# Patient Record
Sex: Male | Born: 1950 | Race: Black or African American | Hispanic: No | Marital: Married | Smoking: Current every day smoker
Health system: Southern US, Community
[De-identification: ages and names within clinical notes are randomized; demographics above are authoritative.]

## PROBLEM LIST (undated history)

## (undated) DIAGNOSIS — R7303 Prediabetes: Secondary | ICD-10-CM

## (undated) DIAGNOSIS — F1410 Cocaine abuse, uncomplicated: Secondary | ICD-10-CM

## (undated) DIAGNOSIS — R569 Unspecified convulsions: Secondary | ICD-10-CM

## (undated) DIAGNOSIS — I10 Essential (primary) hypertension: Secondary | ICD-10-CM

## (undated) DIAGNOSIS — B1920 Unspecified viral hepatitis C without hepatic coma: Secondary | ICD-10-CM

## (undated) DIAGNOSIS — Z720 Tobacco use: Secondary | ICD-10-CM

## (undated) DIAGNOSIS — I6381 Other cerebral infarction due to occlusion or stenosis of small artery: Secondary | ICD-10-CM

## (undated) HISTORY — PX: BACK SURGERY: SHX140

---

## 1999-04-06 ENCOUNTER — Encounter: Payer: Self-pay | Admitting: *Deleted

## 1999-04-06 ENCOUNTER — Emergency Department (HOSPITAL_COMMUNITY): Admission: EM | Admit: 1999-04-06 | Discharge: 1999-04-06 | Payer: Self-pay | Admitting: *Deleted

## 2006-01-10 ENCOUNTER — Emergency Department (HOSPITAL_COMMUNITY): Admission: EM | Admit: 2006-01-10 | Discharge: 2006-01-10 | Payer: Self-pay | Admitting: Emergency Medicine

## 2006-01-25 ENCOUNTER — Emergency Department (HOSPITAL_COMMUNITY): Admission: EM | Admit: 2006-01-25 | Discharge: 2006-01-25 | Payer: Self-pay | Admitting: Emergency Medicine

## 2006-07-24 ENCOUNTER — Emergency Department (HOSPITAL_COMMUNITY): Admission: EM | Admit: 2006-07-24 | Discharge: 2006-07-24 | Payer: Self-pay | Admitting: Emergency Medicine

## 2006-11-03 ENCOUNTER — Ambulatory Visit: Admission: RE | Admit: 2006-11-03 | Discharge: 2006-11-03 | Payer: Self-pay | Admitting: Orthopedic Surgery

## 2006-12-08 ENCOUNTER — Ambulatory Visit (HOSPITAL_COMMUNITY): Admission: RE | Admit: 2006-12-08 | Discharge: 2006-12-08 | Payer: Self-pay | Admitting: Orthopedic Surgery

## 2007-08-26 ENCOUNTER — Emergency Department (HOSPITAL_COMMUNITY): Admission: EM | Admit: 2007-08-26 | Discharge: 2007-08-26 | Payer: Self-pay | Admitting: Emergency Medicine

## 2007-09-01 ENCOUNTER — Emergency Department (HOSPITAL_COMMUNITY): Admission: EM | Admit: 2007-09-01 | Discharge: 2007-09-01 | Payer: Self-pay | Admitting: Emergency Medicine

## 2007-11-21 ENCOUNTER — Inpatient Hospital Stay (HOSPITAL_COMMUNITY): Admission: RE | Admit: 2007-11-21 | Discharge: 2007-11-25 | Payer: Self-pay | Admitting: Orthopedic Surgery

## 2007-11-28 ENCOUNTER — Emergency Department (HOSPITAL_COMMUNITY): Admission: EM | Admit: 2007-11-28 | Discharge: 2007-11-28 | Payer: Self-pay | Admitting: Emergency Medicine

## 2008-02-16 ENCOUNTER — Encounter: Admission: RE | Admit: 2008-02-16 | Discharge: 2008-05-16 | Payer: Self-pay | Admitting: Orthopedic Surgery

## 2009-05-18 ENCOUNTER — Emergency Department (HOSPITAL_COMMUNITY): Admission: EM | Admit: 2009-05-18 | Discharge: 2009-05-18 | Payer: Self-pay | Admitting: Emergency Medicine

## 2009-08-13 IMAGING — CR DG KNEE 1-2V PORT*L*
2 series · 2 of 2 positions shown · non-contrast
Comparison: None.

CLINICAL DATA: Osteoarthritis.  Status post knee replacement.

PORTABLE LEFT KNEE - 1-2 VIEW

[view not recorded (1 of 2)]
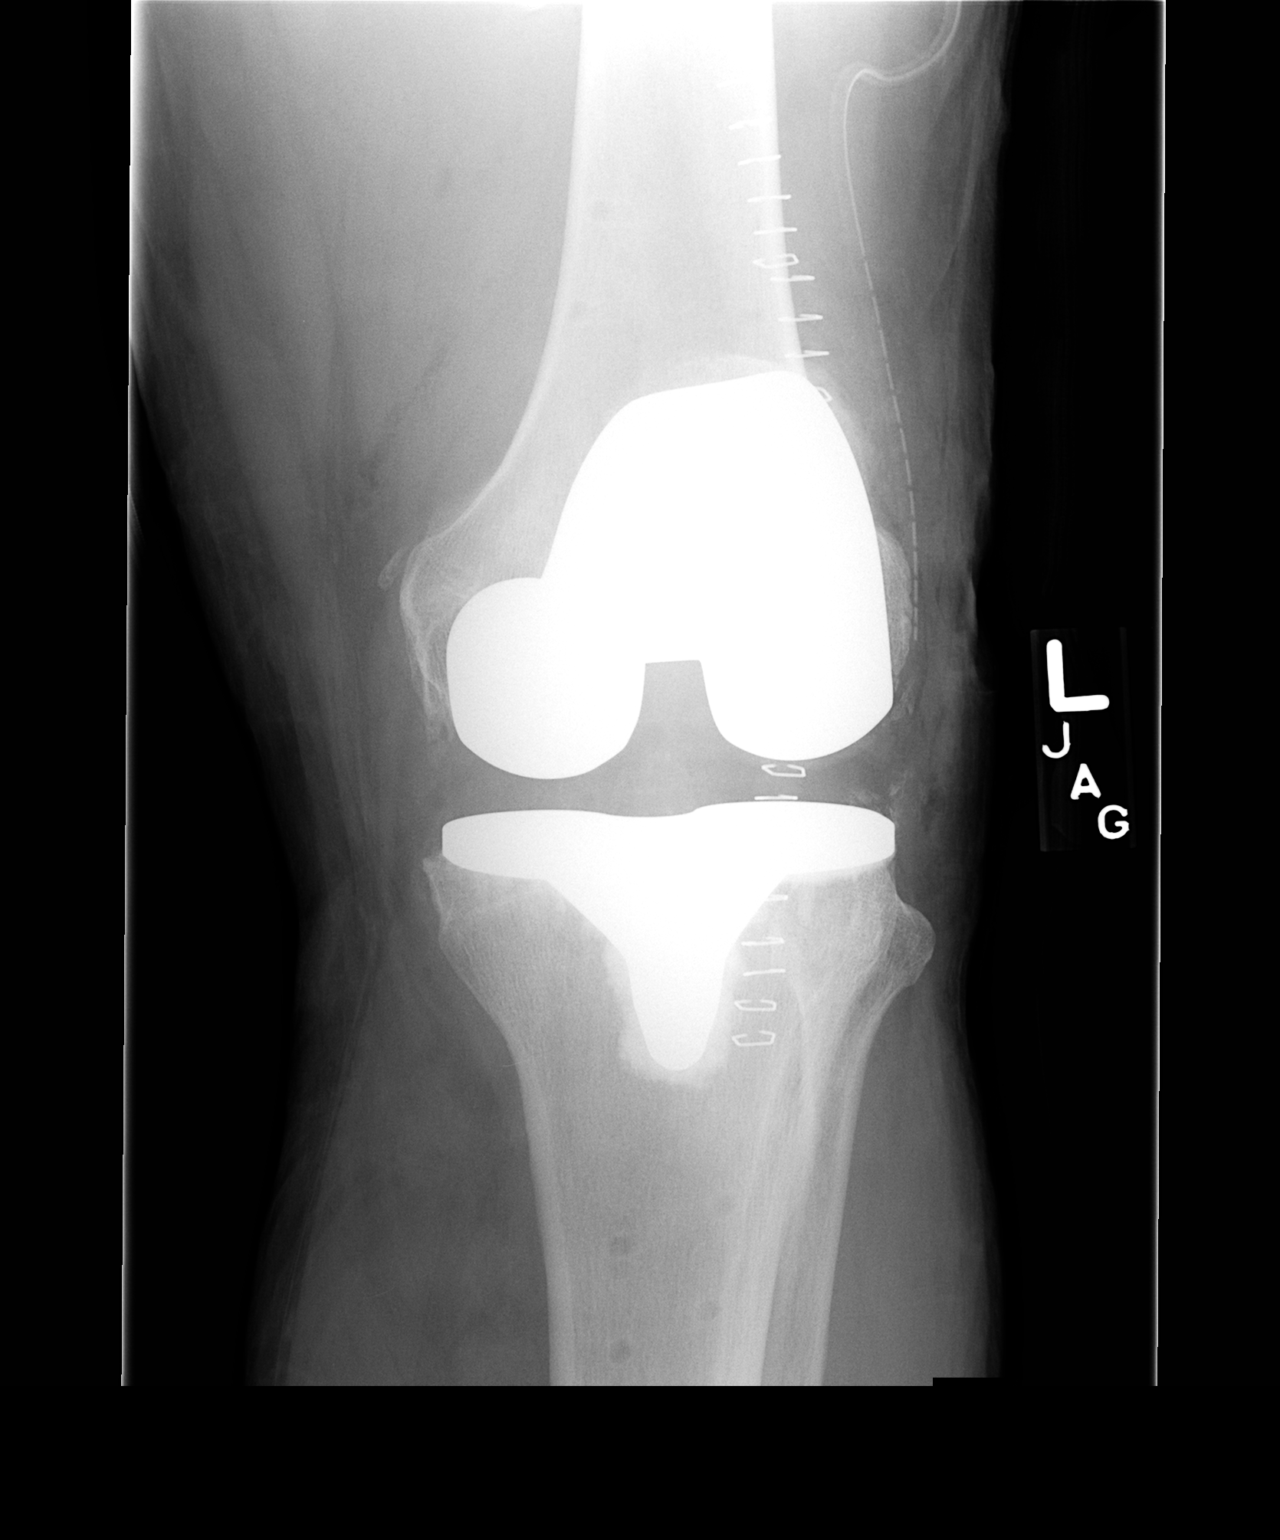

[view not recorded (2 of 2)]
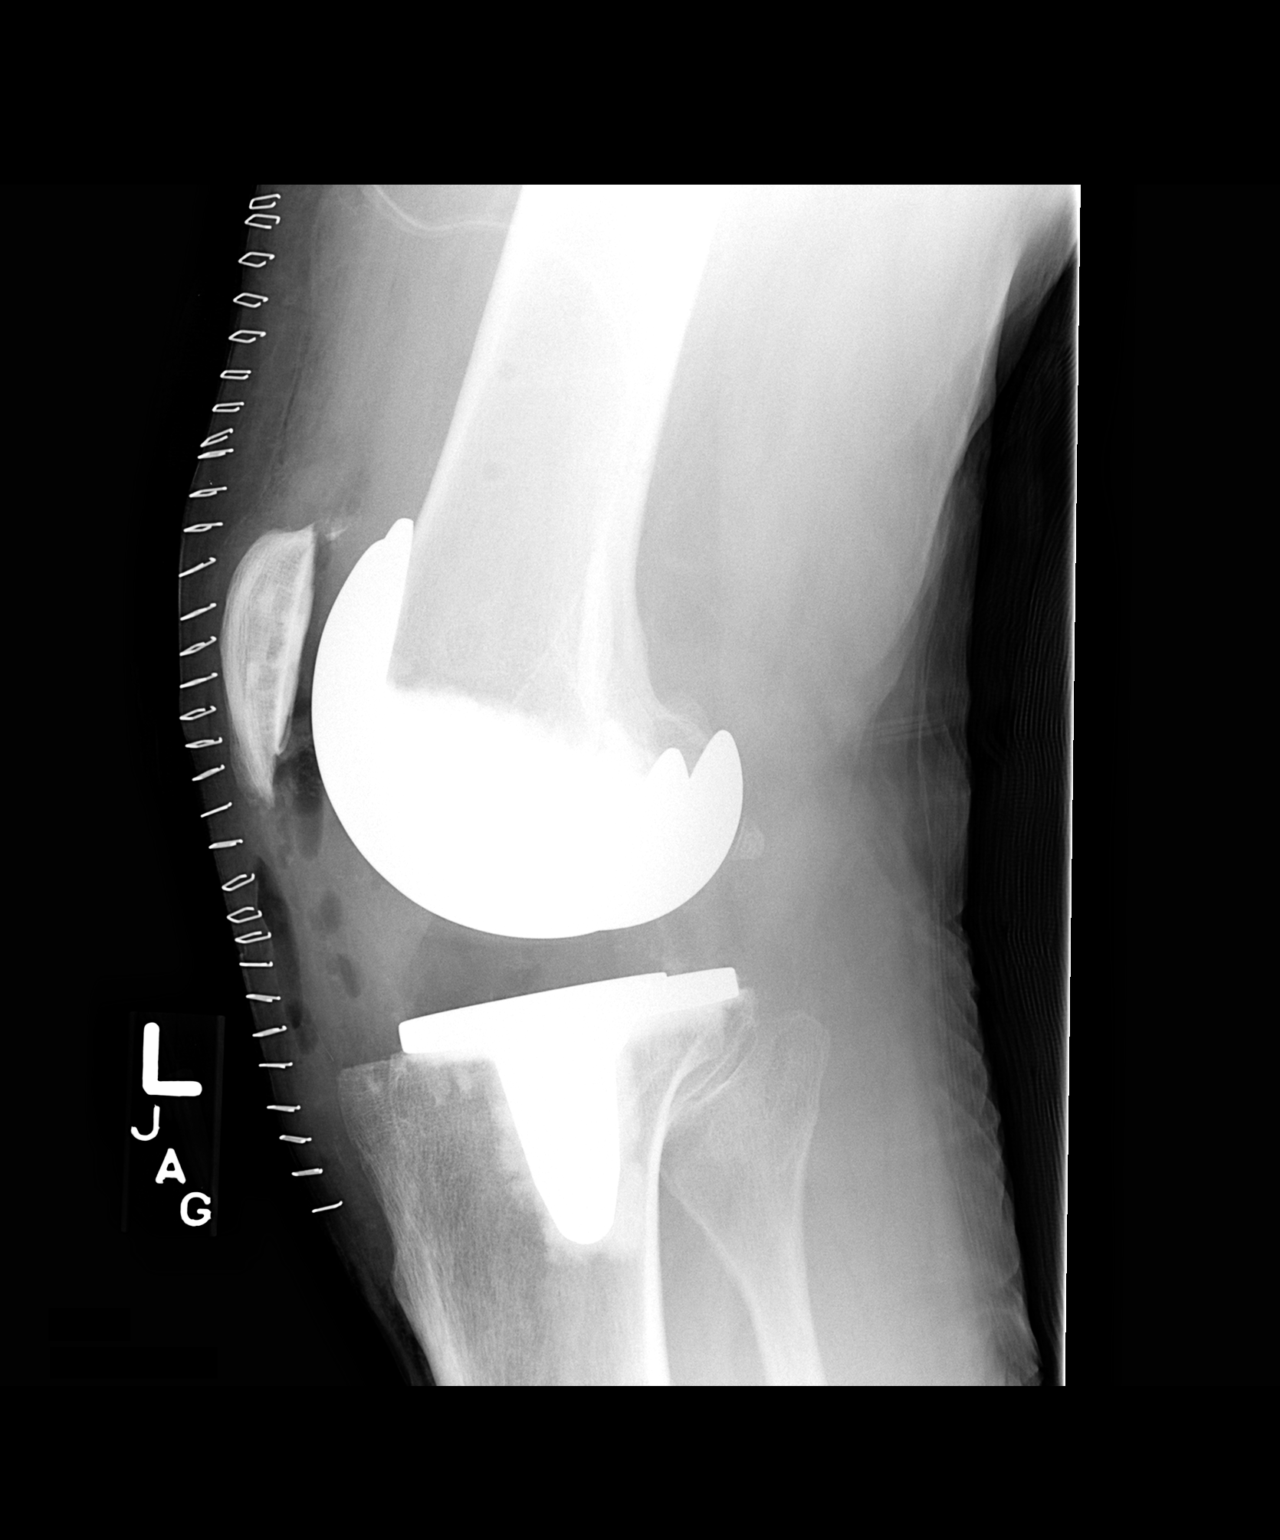

[2 of 2 positions shown; findings below may reference images not displayed]

FINDINGS: Status post total left knee replacement appearing in
satisfactory position.  Surgical drain is in place.
IMPRESSION: Left total knee replacement appears in satisfactory position.

## 2010-04-07 LAB — BASIC METABOLIC PANEL
BUN: 10 mg/dL (ref 6–23)
CO2: 26 mEq/L (ref 19–32)
Chloride: 106 mEq/L (ref 96–112)
GFR calc Af Amer: >60 mL/min (ref >60)
Glucose, Bld: 119 mg/dL — ABNORMAL HIGH (ref 70–99)

## 2010-04-07 LAB — CBC
MCHC: 33.4 g/dL (ref 30.0–36.0)
RDW: 15.5 % (ref 11.5–15.5)

## 2010-04-07 LAB — POCT CARDIAC MARKERS: Troponin i, poc: <0.05 ng/mL (ref 0.00–0.09)

## 2010-04-07 LAB — DIFFERENTIAL
Basophils Absolute: 0 10*3/uL (ref 0.0–0.1)
Eosinophils Relative: 5 % (ref 0–5)
Lymphocytes Relative: 48 % — ABNORMAL HIGH (ref 12–46)
Lymphs Abs: 3.1 10*3/uL (ref 0.7–4.0)
Monocytes Absolute: 0.5 10*3/uL (ref 0.1–1.0)
Neutro Abs: 2.6 10*3/uL (ref 1.7–7.7)

## 2010-05-13 ENCOUNTER — Emergency Department (HOSPITAL_COMMUNITY)
Admission: EM | Admit: 2010-05-13 | Discharge: 2010-05-13 | Disposition: A | Discharge status: Home / Self Care | Payer: Non-veteran care | Attending: Emergency Medicine | Admitting: Emergency Medicine

## 2010-05-13 DIAGNOSIS — K089 Disorder of teeth and supporting structures, unspecified: Secondary | ICD-10-CM | POA: Insufficient documentation

## 2010-05-13 DIAGNOSIS — I2510 Atherosclerotic heart disease of native coronary artery without angina pectoris: Secondary | ICD-10-CM | POA: Insufficient documentation

## 2010-05-13 DIAGNOSIS — Z8673 Personal history of transient ischemic attack (TIA), and cerebral infarction without residual deficits: Secondary | ICD-10-CM | POA: Insufficient documentation

## 2010-05-13 DIAGNOSIS — M25569 Pain in unspecified knee: Secondary | ICD-10-CM | POA: Insufficient documentation

## 2010-05-13 DIAGNOSIS — M549 Dorsalgia, unspecified: Secondary | ICD-10-CM | POA: Insufficient documentation

## 2010-05-13 DIAGNOSIS — I10 Essential (primary) hypertension: Secondary | ICD-10-CM | POA: Insufficient documentation

## 2010-05-13 DIAGNOSIS — G40909 Epilepsy, unspecified, not intractable, without status epilepticus: Secondary | ICD-10-CM | POA: Insufficient documentation

## 2010-05-13 DIAGNOSIS — G8929 Other chronic pain: Secondary | ICD-10-CM | POA: Insufficient documentation

## 2010-06-02 NOTE — Consult Note (Signed)
[name], Thornton NO.:  [id number]   MEDICAL RECORD NO.:  [id number]          PATIENT TYPE:  INP   LOCATION:  5036                         FACILITY:  [facility]   PHYSICIAN:  Jamison Neighbor, M.D.  DATE OF BIRTH:  07/05/1950   DATE OF CONSULTATION:  11/21/2007  DATE OF DISCHARGE:                                 CONSULTATION   CONSULTING PHYSICIAN:  Burnard Bunting, M.D.   REASON FOR CONSULTATION:  Inability to place Foley catheter.   HISTORY:  This 60 year old male has left-knee osteoarthritis and is  scheduled to undergo left-knee arthroplasty.  The physician assistant  was unable to insert a Foley catheter, then he asked the catheter be  inserted following end of the procedure.   The patient is found in the recovery room.  He is somewhat obtunded from  his surgery, but can give at least a small amount of history.  There is  a history of some prostate problems but is unclear to me whether he has  ever had surgery.  According to the PA, he tried to pass a Coude  catheter, was unable to so, just that there may be at least some degree  of bladder neck obstruction or stricture.   PAST MEDICAL HISTORY:  1. Esophageal reflux.  2. Chronic anxiety.  3. Arthritis.  4. Bronchitis.  5. Depression.  6. Hepatitis C.  7. Hypertension.   FAMILY HISTORY:  He has a family history of diabetes and hypertension.   SOCIAL HISTORY:  Pertinent for a past history of cocaine use.  He does  smoke cigarettes and drinks wine.   The patient has had right knee arthroplasty.  He has also had back  surgery.   MEDICATIONS:  1. Aspirin.  2. Atenolol.  3. Vitamin D3.  4. Fish oil.  5. Hydrochlorothiazide.  6. Hydrocodone.  7. Ibuprofen.  8. Ketoconazole.  9. Lisinopril.  10.Loratadine.  11.Mirtazapine.  12.Nitroglycerin.  13.Quetiapine.  14.Ranitidine.  15.Simvastatin.  16.Albuterol.  17.Terazosin.  18.Acyclovir.   I did not do a rectal examination.  The patient is in a  knee immobilizer  and could not be placed in position for appropriate rectal examination.   I placed a 16-French Foley catheter without difficulty.  Clear urine was  obtained.  The patient did seem to have a slightly tight bladder neck  and this may require evaluation in the future.   Leave the Foley catheter until the patient is ambulatory.  Let me know  if there is any further that I can do to help during this hospital stay.   I appreciate the opportunity to share in his care.      Jamison Neighbor, M.D.  Electronically Signed     RJE/MEDQ  D:  11/21/2007  T:  11/22/2007  Job:  [id number]

## 2010-06-02 NOTE — Op Note (Signed)
NAMESTEVENSON, WINDMILLER NO.:  [id number]   MEDICAL RECORD NO.:  [id number]          PATIENT TYPE:  INP   LOCATION:  5036                         FACILITY:  [facility]   PHYSICIAN:  Burnard Bunting, M.D.    DATE OF BIRTH:  1950/06/13   DATE OF PROCEDURE:  11/21/2007  DATE OF DISCHARGE:                               OPERATIVE REPORT   PREOPERATIVE DIAGNOSIS:  Left knee arthritis.   POSTOPERATIVE DIAGNOSIS:  Left knee arthritis.   PROCEDURE:  Left total knee replacement.   SURGEON:  Burnard Bunting, M.D.   ASSISTANT:  Wende Neighbors, P.A.   ANESTHESIA:  General endotracheal.   ESTIMATED BLOOD LOSS:  150.   DRAINS:  Hemovac x1.   INDICATIONS:  Matthew Thornton is a 60 year old patient with end-stage  left knee arthritis who presents now for operative management of his  knee pain after explanation of risks and benefits.   COMPONENTS:  DePuy rotating platform size 6 femur, 5 tibia, 12.5 spacer,  and 41 patella.   TOURNIQUET TIME:  1 hour 53 minutes at 300 mmHg.   PROCEDURE IN DETAIL:  The patient was brought to the operating room  where general endotracheal anesthesia was induced.  Preoperative  antibiotics administered.  Time-out was called.  Left leg was pre-  scrubbed with chlorhexidine alcohol and Betadine, which allowed to air  dry and then prepped DuraPrep solution and draped in a sterile manner  using an Ioban over the entire operative field.  The leg was elevated  and exsanguinated with an Esmarch, wrapped and tourniquet was inflated.  Anterior approach to the knee was utilized.  The skin and subcutaneous  tissue were sharply divided.  Median parapatellar arthrotomy was made.  Precise location of the arthrotomy was marked with #1 Vicryl suture.  Patella was everted.  Osteophytes were removed.  Lateral patellofemoral  ligament was released.  Fat pad was partially excised.  Synovectomy was  performed because of inflamed tissue surrounding the capsule.  The  ACL  and PCL were released with 2 separate stab incisions, proximal distal  medial bicortical pins were placed in the femur and proximal medial pins  were placed in the anteromedial tibia proximally.  The registration  points were obtained including the hips and rotation ankle as well as  medial and lateral malleolus as well as various points around the knee.  Tibial plateau was then cut in perpendicular mechanical axis.  The  tensioner was placed in both flexion and extension, distal femoral cut  was made, checked with spacer block in both flexion and extension.  The  chamfer cuts were then performed.  Box cut was made, tibia was prepared  with keel punch.  Trial components were placed.  The patient had full  extension, excellent flexion, good stability, varus-valgus stress at 0-  39 degrees with a 12.5 spacer in place.  Patella was then cut freehand  and fitted with 41 patellar button with the patellar height going from  28-25 mm.  The knee had excellent patellar tracking with no-thumbs  technique.  Trial components were removed.  Thorough irrigation was  performed.  The component were then cemented into position.  The excess  cement was then removed.  The same stability parameters and range of  motion parameters were maintained.  Tourniquet was released.  Bleeding  points were encountered for electrocautery.  Hemovac drain was placed.  Thorough irrigation was again performed.  Parapatellar arthrotomy was  then closed using #1 Vicryl suture in an interrupted fashion followed by  interrupted inverted 0 Vicryl suture, 2-0 Vicryl sutures, and skin  staples.  Medial stab incisions for the pins were also irrigated and  closed using 3-0 nylon suture.  A solution of Marcaine, morphine, and  clonidine was injected to the knee.  Bulky dressing and knee immobilizer  was placed.  The patient tolerated the procedure well without immediate  complication.  Velna Hatchet Vernon's assistance was required at  all times  during the case for retraction of __________ neurovascular structures  and limb positioning.  Her assistance was medical necessity.      Burnard Bunting, M.D.  Electronically Signed     GSD/MEDQ  D:  11/21/2007  T:  11/22/2007  Job:  [id number]

## 2010-06-02 NOTE — Discharge Summary (Signed)
NAMEAVRIAN, DELFAVERO NO.:  [id number]   MEDICAL RECORD NO.:  [id number]          PATIENT TYPE:  INP   LOCATION:  5036                         FACILITY:  [facility]   PHYSICIAN:  Burnard Bunting, M.D.    DATE OF BIRTH:  [birth year]   DATE OF ADMISSION:  11/21/2007  DATE OF DISCHARGE:  11/25/2007                               DISCHARGE SUMMARY   ADMISSION DIAGNOSES:  1. Left knee arthritis.  2. Gastroesophageal reflux disease.  3. Depression.  4. Hypertension.  5. Benign prostatic hyperplasia.  6. Seizure disorder.  7. Asthma.  8. Chronic bronchitis.  9. History of hepatitis C.  10.Remote history of cocaine abuse.  11.Chronic anxiety.   DISCHARGE DIAGNOSES:  1. Left knee arthritis.  2. Gastroesophageal reflux disease.  3. Depression.  4. Hypertension.  5. Benign prostatic hyperplasia.  6. Seizure disorder.  7. Asthma.  8. Chronic bronchitis.  9. History of hepatitis C.  10.Remote history of cocaine abuse.  11.Chronic anxiety.  12.Posthemorrhagic anemia.   PROCEDURE:  On November 21, 2007, the patient underwent left total knee  arthroplasty performed by Dr. August Saucer assisted by Maud Deed, PA-C under  general anesthesia.   CONSULTATIONS:  Jamison Neighbor, MD of Urology.   BRIEF HISTORY:  Mr. Wynn is a 60 year old male who has failed  conservative treatment for end-stage osteoarthritis of the left knee.  It was felt he would benefit from surgical intervention in the form of a  left total knee arthroplasty and was admitted for this procedure.   BRIEF HOSPITAL COURSE:  Intraoperatively, the patient had difficult  Foley catheterization.  A consult was called from Dr. Logan Bores who came at  the end of the case and placed a catheter into the patient's bladder.  He did advise that the catheter remained in place until the patient was  ambulatory.  He also discussed with the patient the need for evaluation  by urologist on an outpatient basis and the patient  wished to be seen at  the [facility] once he was discharged.  The patient did keep the Foley  catheter until he was ambulatory and it was discontinued and he was able  to void without difficulties.  Postoperatively, neurovascular motor  function of the lower extremities remained intact.  He was started on  physical therapy for ambulation and gait training, range of motion,  stretching and strengthening exercises.  CPM utilized.  The patient was  weightbearing as tolerated.  Dressing changes done daily and wound was  healing well.  Postoperatively, hemoglobin and hematocrit dropped to the  lowest value of 10.8 and 32.7 and the patient did not require blood  transfusion.  The patient was placed on Coumadin for DVT prophylaxis.  Adjustments in Coumadin dose made according to daily protimes.  INR on  admission 1.0.  At discharge 1.9.  Documented in the chart, an erroneous  value of INR greater than 9.8 on November 24, 2007.  The repeat value on  that day was noted to be 1.8.  The patient had elevated blood sugars  during the hospital stay and dietary changes were  made.  One episode of  hyperkalemia at 5.9, which resolved prior to discharge.  Drug screen on  admission was negative for all drugs tested.  Urinalysis on admission  was negative for urinary tract infection.  He was noted on the  urinalysis to have Trichomonas with moderate leukocyte esterase and  bacteria.  Urine culture was negative.  Postoperative x-ray of the left  knee showed stable position and appearance of left total knee  arthroplasty.   PLAN:  The patient was discharged to his home.  Arrangements made for  home health physical therapy.  He will follow up with Dr. August Saucer in the  office, 2 weeks postop.  He is instructed to keep his wound dry and  clean at all times.  His medications were obtained through mailing  service for the [place] and he had these prior to discharge.  Medication  reconciliation form was provided for the  patient with instructions on  resuming home medications as taken prior to admission with the exception  of lidocaine ointment, piroxicam, and atenolol.  He will use Percocet  5/325 one to two every 4-6 hours as needed for pain, Robaxin 500 mg one  every 8 hours as needed for spasm and Coumadin 5 mg daily.  All  questions encouraged and answered.   CONDITION ON DISCHARGE:  Stable.      Wende Neighbors, P.A.      Burnard Bunting, M.D.  Electronically Signed    SMV/MEDQ  D:  02/08/2008  T:  02/09/2008  Job:  [id number]

## 2010-06-03 ENCOUNTER — Emergency Department (HOSPITAL_COMMUNITY)
Admission: EM | Admit: 2010-06-03 | Discharge: 2010-06-03 | Disposition: A | Discharge status: Home / Self Care | Payer: Non-veteran care | Attending: Emergency Medicine | Admitting: Emergency Medicine

## 2010-06-03 DIAGNOSIS — M25569 Pain in unspecified knee: Secondary | ICD-10-CM | POA: Insufficient documentation

## 2010-06-03 DIAGNOSIS — S335XXA Sprain of ligaments of lumbar spine, initial encounter: Secondary | ICD-10-CM | POA: Insufficient documentation

## 2010-06-03 DIAGNOSIS — Y93E9 Activity, other interior property and clothing maintenance: Secondary | ICD-10-CM | POA: Insufficient documentation

## 2010-06-03 DIAGNOSIS — Z8673 Personal history of transient ischemic attack (TIA), and cerebral infarction without residual deficits: Secondary | ICD-10-CM | POA: Insufficient documentation

## 2010-06-03 DIAGNOSIS — M1990 Unspecified osteoarthritis, unspecified site: Secondary | ICD-10-CM | POA: Insufficient documentation

## 2010-06-03 DIAGNOSIS — R209 Unspecified disturbances of skin sensation: Secondary | ICD-10-CM | POA: Insufficient documentation

## 2010-06-03 DIAGNOSIS — Y998 Other external cause status: Secondary | ICD-10-CM | POA: Insufficient documentation

## 2010-06-03 DIAGNOSIS — G40802 Other epilepsy, not intractable, without status epilepticus: Secondary | ICD-10-CM | POA: Insufficient documentation

## 2010-06-03 DIAGNOSIS — I2510 Atherosclerotic heart disease of native coronary artery without angina pectoris: Secondary | ICD-10-CM | POA: Insufficient documentation

## 2010-06-03 DIAGNOSIS — Y92009 Unspecified place in unspecified non-institutional (private) residence as the place of occurrence of the external cause: Secondary | ICD-10-CM | POA: Insufficient documentation

## 2010-06-03 DIAGNOSIS — X503XXA Overexertion from repetitive movements, initial encounter: Secondary | ICD-10-CM | POA: Insufficient documentation

## 2010-06-03 DIAGNOSIS — I10 Essential (primary) hypertension: Secondary | ICD-10-CM | POA: Insufficient documentation

## 2010-10-05 ENCOUNTER — Emergency Department (HOSPITAL_COMMUNITY)
Admission: EM | Admit: 2010-10-05 | Discharge: 2010-10-05 | Disposition: A | Discharge status: Home / Self Care | Payer: Non-veteran care | Attending: Emergency Medicine | Admitting: Emergency Medicine

## 2010-10-05 DIAGNOSIS — R569 Unspecified convulsions: Secondary | ICD-10-CM | POA: Insufficient documentation

## 2010-10-05 DIAGNOSIS — I10 Essential (primary) hypertension: Secondary | ICD-10-CM | POA: Insufficient documentation

## 2010-10-05 DIAGNOSIS — M79609 Pain in unspecified limb: Secondary | ICD-10-CM | POA: Insufficient documentation

## 2010-10-05 DIAGNOSIS — Z8679 Personal history of other diseases of the circulatory system: Secondary | ICD-10-CM | POA: Insufficient documentation

## 2010-10-05 DIAGNOSIS — M5430 Sciatica, unspecified side: Secondary | ICD-10-CM | POA: Insufficient documentation

## 2010-10-05 DIAGNOSIS — I2510 Atherosclerotic heart disease of native coronary artery without angina pectoris: Secondary | ICD-10-CM | POA: Insufficient documentation

## 2010-10-16 LAB — DIFFERENTIAL
Basophils Absolute: 0
Basophils Relative: 1
Eosinophils Relative: 4
Lymphocytes Relative: 36
Neutro Abs: 2.3
Neutrophils Relative %: 44

## 2010-10-16 LAB — GLUCOSE, CAPILLARY: Glucose-Capillary: 155 — ABNORMAL HIGH

## 2010-10-16 LAB — COMPREHENSIVE METABOLIC PANEL
AST: 32
Albumin: 3.7
CO2: 25
Calcium: 9.2
Chloride: 105
GFR calc non Af Amer: 51 — ABNORMAL LOW
Potassium: 3.9
Sodium: 139
Total Bilirubin: 1

## 2010-10-16 LAB — RAPID URINE DRUG SCREEN, HOSP PERFORMED
Barbiturates: NOT DETECTED
Cocaine: POSITIVE — AB

## 2010-10-16 LAB — ETHANOL: Alcohol, Ethyl (B): <5

## 2010-10-16 LAB — URINE CULTURE: Culture: NO GROWTH

## 2010-10-16 LAB — RPR TITER: RPR Titer: 1:2 {titer} — AB

## 2010-10-16 LAB — URINE MICROSCOPIC-ADD ON

## 2010-10-16 LAB — CBC
Hemoglobin: 13.6
Hemoglobin: 14.5
MCHC: 32.4
MCHC: 32.5
MCV: 78.6
Platelets: 144 — ABNORMAL LOW
Platelets: 165
RBC: 5.68
RDW: 15.9 — ABNORMAL HIGH

## 2010-10-16 LAB — URINALYSIS, ROUTINE W REFLEX MICROSCOPIC
Bilirubin Urine: NEGATIVE
Bilirubin Urine: NEGATIVE
Glucose, UA: 250 — AB
Nitrite: NEGATIVE
Protein, ur: NEGATIVE
Specific Gravity, Urine: 1.022
Urobilinogen, UA: 1
pH: 5
pH: 5

## 2010-10-16 LAB — POCT CARDIAC MARKERS
CKMB, poc: 2.6
Myoglobin, poc: 191
Troponin i, poc: <0.05

## 2010-10-16 LAB — POCT I-STAT, CHEM 8
Chloride: 107
HCT: 43
Hemoglobin: 14.6
Potassium: 4.2
Sodium: 139

## 2010-10-16 LAB — RPR: RPR Ser Ql: REACTIVE — AB

## 2010-10-16 LAB — T.PALLIDUM AB, IGG: T pallidum Antibodies (TP-PA): >8 — ABNORMAL HIGH

## 2010-10-16 LAB — APTT: aPTT: 31

## 2010-10-19 LAB — URINALYSIS, ROUTINE W REFLEX MICROSCOPIC
Glucose, UA: NEGATIVE
Ketones, ur: NEGATIVE
Nitrite: NEGATIVE
Protein, ur: NEGATIVE
Urobilinogen, UA: 1

## 2010-10-19 LAB — URINE CULTURE: Culture: NO GROWTH

## 2010-10-19 LAB — CBC
HCT: 48.2
MCHC: 32.3
MCV: 79.2
Platelets: 199
WBC: 5

## 2010-10-19 LAB — COMPREHENSIVE METABOLIC PANEL
BUN: 13
CO2: 26
Calcium: 9.7
Chloride: 106
Creatinine, Ser: 1.14
GFR calc non Af Amer: >60
Total Bilirubin: 1.1

## 2010-10-19 LAB — DIFFERENTIAL
Basophils Absolute: 0
Basophils Relative: 1
Lymphocytes Relative: 44
Neutro Abs: 2
Neutrophils Relative %: 41 — ABNORMAL LOW

## 2010-10-19 LAB — PROTIME-INR
INR: 1
Prothrombin Time: 13.2

## 2010-10-19 LAB — APTT: aPTT: 32

## 2010-10-19 LAB — DRUGS OF ABUSE SCREEN W/O ALC, ROUTINE URINE
Cocaine Metabolites: NEGATIVE
Opiate Screen, Urine: NEGATIVE
Phencyclidine (PCP): NEGATIVE
Propoxyphene: NEGATIVE

## 2010-10-19 LAB — TYPE AND SCREEN

## 2010-10-20 LAB — COMPREHENSIVE METABOLIC PANEL
BUN: 15
CO2: 28
Chloride: 102
Creatinine, Ser: 1.06
GFR calc non Af Amer: >60
Glucose, Bld: 205 — ABNORMAL HIGH
Total Bilirubin: 1

## 2010-10-20 LAB — BASIC METABOLIC PANEL
CO2: 27
CO2: 28
Calcium: 8.2 — ABNORMAL LOW
Chloride: 102
Chloride: 104
GFR calc Af Amer: 40 — ABNORMAL LOW
GFR calc Af Amer: 53 — ABNORMAL LOW
GFR calc Af Amer: >60
GFR calc non Af Amer: 33 — ABNORMAL LOW
Potassium: 4.1
Sodium: 136
Sodium: 136
Sodium: 138

## 2010-10-20 LAB — CBC
HCT: 32.7 — ABNORMAL LOW
HCT: 37.2 — ABNORMAL LOW
Hemoglobin: 10.8 — ABNORMAL LOW
Hemoglobin: 12.1 — ABNORMAL LOW
Hemoglobin: 12.2 — ABNORMAL LOW
Hemoglobin: 13.4
MCHC: 32.5
MCHC: 33
MCHC: 33.5
MCV: 78.2
MCV: 78.3
Platelets: 245
RBC: 4.12 — ABNORMAL LOW
RBC: 4.6
RBC: 5.2
RDW: 15
WBC: 7.6

## 2010-10-20 LAB — URINALYSIS, ROUTINE W REFLEX MICROSCOPIC
Nitrite: NEGATIVE
Specific Gravity, Urine: 1.03
Urobilinogen, UA: 1
pH: 5

## 2010-10-20 LAB — DIFFERENTIAL
Basophils Absolute: 0
Basophils Relative: 0
Lymphocytes Relative: 17
Neutro Abs: 5.3
Neutrophils Relative %: 70

## 2010-10-20 LAB — PROTIME-INR
INR: 1.1
INR: 1.8 — ABNORMAL HIGH
Prothrombin Time: 21.7 — ABNORMAL HIGH

## 2010-10-20 LAB — URINE MICROSCOPIC-ADD ON

## 2010-10-20 LAB — RAPID URINE DRUG SCREEN, HOSP PERFORMED: Benzodiazepines: NOT DETECTED

## 2010-10-27 LAB — BILIRUBIN, DIRECT: Bilirubin, Direct: 0.2

## 2010-10-27 LAB — CBC
HCT: 49.3
Hemoglobin: 15.6
MCV: 78.2
Platelets: 184
RDW: 14.7
WBC: 7.5

## 2010-10-27 LAB — COMPREHENSIVE METABOLIC PANEL
Albumin: 3.7
Alkaline Phosphatase: 83
BUN: 13
Chloride: 105
Creatinine, Ser: 1.26
Glucose, Bld: 113 — ABNORMAL HIGH
Potassium: 4.7
Total Bilirubin: 0.7

## 2010-10-27 LAB — URINALYSIS, ROUTINE W REFLEX MICROSCOPIC
Hgb urine dipstick: NEGATIVE
Ketones, ur: 15 — AB
Nitrite: NEGATIVE
Protein, ur: NEGATIVE
Urobilinogen, UA: 1

## 2010-10-27 LAB — PROTIME-INR
INR: 1
Prothrombin Time: 13.1

## 2010-10-27 LAB — APTT: aPTT: 31

## 2010-10-27 LAB — URINE MICROSCOPIC-ADD ON

## 2010-10-27 LAB — DIFFERENTIAL
Basophils Absolute: 0
Basophils Relative: 1
Eosinophils Absolute: 0.4
Monocytes Relative: 9
Neutro Abs: 3.1
Neutrophils Relative %: 42 — ABNORMAL LOW

## 2010-10-27 LAB — TYPE AND SCREEN
ABO/RH(D): A POS
Antibody Screen: NEGATIVE

## 2010-10-28 LAB — URINALYSIS, ROUTINE W REFLEX MICROSCOPIC
Bilirubin Urine: NEGATIVE
Glucose, UA: NEGATIVE
Hgb urine dipstick: NEGATIVE
Ketones, ur: NEGATIVE
Protein, ur: NEGATIVE
Specific Gravity, Urine: 1.018
Urobilinogen, UA: 0.2
pH: 5

## 2010-10-28 LAB — BASIC METABOLIC PANEL
CO2: 23
Calcium: 9.1
Chloride: 108
Creatinine, Ser: 1
GFR calc Af Amer: >60
Glucose, Bld: 132 — ABNORMAL HIGH
Potassium: 4.8

## 2010-10-28 LAB — CBC
Hemoglobin: 14.8
MCHC: 32.7
MCV: 78.4
RBC: 5.76
RDW: 15.4 — ABNORMAL HIGH

## 2010-10-28 LAB — DIFFERENTIAL
Basophils Absolute: 0
Basophils Relative: 0
Eosinophils Absolute: 0.3
Monocytes Absolute: 0.4
Monocytes Relative: 6
Neutrophils Relative %: 45

## 2010-10-28 LAB — URINE CULTURE: Colony Count: >=100000

## 2010-10-28 LAB — URINE MICROSCOPIC-ADD ON

## 2010-10-28 LAB — TYPE AND SCREEN
ABO/RH(D): A POS
Antibody Screen: NEGATIVE

## 2010-12-30 ENCOUNTER — Emergency Department (HOSPITAL_COMMUNITY)
Admission: EM | Admit: 2010-12-30 | Discharge: 2010-12-30 | Disposition: A | Discharge status: Home / Self Care | Payer: Non-veteran care | Attending: Emergency Medicine | Admitting: Emergency Medicine

## 2010-12-30 DIAGNOSIS — M549 Dorsalgia, unspecified: Secondary | ICD-10-CM | POA: Insufficient documentation

## 2010-12-30 DIAGNOSIS — E119 Type 2 diabetes mellitus without complications: Secondary | ICD-10-CM | POA: Insufficient documentation

## 2010-12-30 DIAGNOSIS — F17200 Nicotine dependence, unspecified, uncomplicated: Secondary | ICD-10-CM | POA: Insufficient documentation

## 2010-12-30 DIAGNOSIS — I10 Essential (primary) hypertension: Secondary | ICD-10-CM | POA: Insufficient documentation

## 2010-12-30 HISTORY — DX: Essential (primary) hypertension: I10

## 2010-12-30 MED ORDER — HYDROCODONE-ACETAMINOPHEN 5-325 MG PO TABS: 1.0000 | ORAL_TABLET | ORAL | Status: AC | PRN

## 2010-12-30 MED ORDER — CYCLOBENZAPRINE HCL 10 MG PO TABS: 10.0000 mg | ORAL_TABLET | Freq: Two times a day (BID) | ORAL | Status: AC | PRN

## 2010-12-30 MED ORDER — HYDROCODONE-ACETAMINOPHEN 5-325 MG PO TABS
1.0000 | ORAL_TABLET | Freq: Once | ORAL | Status: AC
  Administered 2010-12-30: 1 via ORAL
  Filled 2010-12-30: qty 1

## 2010-12-30 NOTE — ED Provider Notes (Signed)
History     CSN: [id number] Arrival date & time: 12/30/2010  7:20 AM   First MD Initiated Contact with Patient 12/30/10 [phone]      Chief Complaint  Patient presents with  . Back Pain    got an MRI yesterday in [place] and began having back pain,  MRI yesterday was of the back.   Last night he rolled over and his back popped.     (Consider location/radiation/quality/duration/timing/severity/associated sxs/prior treatment) Patient is a 60 y.o. male presenting with back pain. The history is provided by the patient.  Back Pain  This is a recurrent problem. The current episode started more than 1 week ago. The problem occurs constantly. Progression since onset: Worse since yesterday after twisting in bed. Associated with: He has a history of previous back surgery with intermittent flare ups. The symptoms are aggravated by bending, twisting and certain positions. Pertinent negatives include no fever, no abdominal pain, no bowel incontinence, no bladder incontinence and no paresthesias.    Past Medical History  Diagnosis Date  . Hypertension   . Diabetes mellitus   . Cardiomyopathy     Past Surgical History  Procedure Date  . Back surgery     History reviewed. No pertinent family history.  History  Substance Use Topics  . Smoking status: Current Everyday Smoker    Types: Cigarettes  . Smokeless tobacco: Never Used  . Alcohol Use: Yes      Review of Systems  Constitutional: Negative for fever and chills.  HENT: Negative.   Respiratory: Negative.   Cardiovascular: Negative.   Gastrointestinal: Negative.  Negative for abdominal pain and bowel incontinence.  Genitourinary: Negative for bladder incontinence.  Musculoskeletal: Positive for back pain.       See HPI  Skin: Negative.   Neurological: Negative.  Negative for paresthesias.    Allergies  Review of patient's allergies indicates no known allergies.  Home Medications  No current outpatient prescriptions on  file.  BP 162/100  Pulse 70  Temp(Src) 98.1 F (36.7 C) (Oral)  Resp 18  Ht 6\' 3"  (1.905 m)  Wt 225 lb (102.059 kg)  BMI 28.12 kg/m2  SpO2 94%  Physical Exam  Constitutional: He is oriented to person, place, and time. He appears well-developed and well-nourished.  HENT:  Head: Normocephalic.  Neck: Normal range of motion. Neck supple.  Pulmonary/Chest: Effort normal.  Abdominal: Soft. Bowel sounds are normal. He exhibits no mass. There is no tenderness. There is no rebound and no guarding.  Musculoskeletal: Normal range of motion. He exhibits no edema.       Right paralumbar tenderness without swelling, discoloration. No sciatic tenderness on right. Distal pulses 2+.  Neurological: He is alert and oriented to person, place, and time. He has normal reflexes. No cranial nerve deficit or sensory deficit.  Skin: Skin is warm and dry. No rash noted.  Psychiatric: He has a normal mood and affect.    ED Course  Procedures (including critical care time)  Labs Reviewed - No data to display No results found.   No diagnosis found.    MDM          Rodena Medin, PA 12/30/10 [phone]

## 2010-12-30 NOTE — ED Notes (Signed)
Last night rolled over in bed and felt his back pop.  He has a hx of back surgery X2 and pain

## 2010-12-30 NOTE — ED Notes (Signed)
[facility] [place] did not get results his pcp is in [place]. Felt a pop in hhis back last night needs pain meds

## 2010-12-30 NOTE — ED Provider Notes (Signed)
Medical screening examination/treatment/procedure(s) were performed by non-physician practitioner and as supervising physician I was immediately available for consultation/collaboration.  Doug Sou, MD 12/30/10 1309

## 2011-07-04 ENCOUNTER — Emergency Department (HOSPITAL_COMMUNITY): Payer: Non-veteran care

## 2011-07-04 ENCOUNTER — Encounter (HOSPITAL_COMMUNITY): Payer: Self-pay | Admitting: Emergency Medicine

## 2011-07-04 ENCOUNTER — Emergency Department (HOSPITAL_COMMUNITY)
Admission: EM | Admit: 2011-07-04 | Discharge: 2011-07-04 | Disposition: A | Discharge status: Home / Self Care | Payer: Non-veteran care | Attending: Emergency Medicine | Admitting: Emergency Medicine

## 2011-07-04 DIAGNOSIS — F17200 Nicotine dependence, unspecified, uncomplicated: Secondary | ICD-10-CM | POA: Insufficient documentation

## 2011-07-04 DIAGNOSIS — Y92009 Unspecified place in unspecified non-institutional (private) residence as the place of occurrence of the external cause: Secondary | ICD-10-CM | POA: Insufficient documentation

## 2011-07-04 DIAGNOSIS — Y9301 Activity, walking, marching and hiking: Secondary | ICD-10-CM | POA: Insufficient documentation

## 2011-07-04 DIAGNOSIS — IMO0002 Reserved for concepts with insufficient information to code with codable children: Secondary | ICD-10-CM | POA: Insufficient documentation

## 2011-07-04 DIAGNOSIS — W19XXXA Unspecified fall, initial encounter: Secondary | ICD-10-CM

## 2011-07-04 DIAGNOSIS — S46909A Unspecified injury of unspecified muscle, fascia and tendon at shoulder and upper arm level, unspecified arm, initial encounter: Secondary | ICD-10-CM | POA: Insufficient documentation

## 2011-07-04 DIAGNOSIS — I10 Essential (primary) hypertension: Secondary | ICD-10-CM | POA: Insufficient documentation

## 2011-07-04 DIAGNOSIS — S40022A Contusion of left upper arm, initial encounter: Secondary | ICD-10-CM

## 2011-07-04 DIAGNOSIS — Y998 Other external cause status: Secondary | ICD-10-CM | POA: Insufficient documentation

## 2011-07-04 DIAGNOSIS — S4980XA Other specified injuries of shoulder and upper arm, unspecified arm, initial encounter: Secondary | ICD-10-CM | POA: Insufficient documentation

## 2011-07-04 DIAGNOSIS — E119 Type 2 diabetes mellitus without complications: Secondary | ICD-10-CM | POA: Insufficient documentation

## 2011-07-04 DIAGNOSIS — S40029A Contusion of unspecified upper arm, initial encounter: Secondary | ICD-10-CM | POA: Insufficient documentation

## 2011-07-04 DIAGNOSIS — W108XXA Fall (on) (from) other stairs and steps, initial encounter: Secondary | ICD-10-CM | POA: Insufficient documentation

## 2011-07-04 DIAGNOSIS — S4991XA Unspecified injury of right shoulder and upper arm, initial encounter: Secondary | ICD-10-CM

## 2011-07-04 MED ORDER — HYDROCODONE-ACETAMINOPHEN 5-325 MG PO TABS
1.0000 | ORAL_TABLET | Freq: Once | ORAL | Status: AC
  Administered 2011-07-04: 1 via ORAL
  Filled 2011-07-04: qty 1

## 2011-07-04 MED ORDER — HYDROCODONE-ACETAMINOPHEN 5-325 MG PO TABS: 1.0000 | ORAL_TABLET | Freq: Four times a day (QID) | ORAL | Status: AC | PRN

## 2011-07-04 MED ORDER — NAPROXEN 500 MG PO TABS: 500.0000 mg | ORAL_TABLET | Freq: Two times a day (BID) | ORAL | Status: DC

## 2011-07-04 NOTE — Discharge Instructions (Signed)
As we discussed, you can wear the shoulder immobilizer for comfort. He should perform the demonstrated range of motion exercises a few times a day. Apply ice to your injured areas for 15-20 minutes three times a day for the next several days to help reduce swelling and inflammation. Clean your wounds with warm soapy water each day. Apply antibiotic ointment well the wounds are fresh and a clean dressing as needed.    Shoulder Pain The shoulder is a ball and socket joint. The muscles and tendons (rotator cuff) are what keep the shoulder in its joint and stable. This collection of muscles and tendons holds in the head (ball) of the humerus (upper arm bone) in the fossa (cup) of the scapula (shoulder blade). Today no reason was found for your shoulder pain. Often pain in the shoulder may be treated conservatively with temporary immobilization. For example, holding the shoulder in one place using a sling for rest. Physical therapy may be needed if problems continue. HOME CARE INSTRUCTIONS   Apply ice to the sore area for 15 to 20 minutes, 3 to 4 times per day for the first 2 days. Put the ice in a plastic bag. Place a towel between the bag of ice and your skin.   If you have or were given a shoulder sling and straps, do not remove for as long as directed by your caregiver or until you see a caregiver for a follow-up examination. If you need to remove it to shower or bathe, move your arm as little as possible.   Sleep on several pillows at night to lessen swelling and pain.   Only take over-the-counter or prescription medicines for pain, discomfort, or fever as directed by your caregiver.   Keep any follow-up appointments in order to avoid any type of permanent shoulder disability or chronic pain problems.  SEEK MEDICAL CARE IF:   Pain in your shoulder increases or new pain develops in your arm, hand, or fingers.   Your hand or fingers are colder than your other hand.   You do not obtain pain  relief with the medications or your pain becomes worse.  SEEK IMMEDIATE MEDICAL CARE IF:   Your arm, hand, or fingers are numb or tingling.   Your arm, hand, or fingers are swollen, painful, or turn white or blue.   You develop chest pain or shortness of breath.  MAKE SURE YOU:   Understand these instructions.   Will watch your condition.   Will get help right away if you are not doing well or get worse.  Document Released: 10/14/2004 Document Revised: 12/24/2010 Document Reviewed: 12/19/2010 [facility] Patient Information 2012 [place], [place].      Abrasions Abrasions are skin scrapes. Their treatment depends on how large and deep the abrasion is. Abrasions do not extend through all layers of the skin. A cut or lesion through all skin layers is called a laceration. HOME CARE INSTRUCTIONS   If you were given a dressing, change it at least once a day or as instructed by your caregiver. If the bandage sticks, soak it off with a solution of water or hydrogen peroxide.   Twice a day, wash the area with soap and water to remove all the cream/ointment. You may do this in a sink, under a tub faucet, or in a shower. Rinse off the soap and pat dry with a clean towel. Look for signs of infection (see below).   Reapply cream/ointment according to your caregiver's instruction. This will help  prevent infection and keep the bandage from sticking. Telfa or gauze over the wound and under the dressing or wrap will also help keep the bandage from sticking.   If the bandage becomes wet, dirty, or develops a foul smell, change it as soon as possible.   Only take over-the-counter or prescription medicines for pain, discomfort, or fever as directed by your caregiver.  SEEK IMMEDIATE MEDICAL CARE IF:   Increasing pain in the wound.   Signs of infection develop: redness, swelling, surrounding area is tender to touch, or pus coming from the wound.   You have a fever.   Any foul smell coming from the  wound or dressing.  Most skin wounds heal within ten days. Facial wounds heal faster. However, an infection may occur despite proper treatment. You should have the wound checked for signs of infection within 24 to 48 hours or sooner if problems arise. If you were not given a wound-check appointment, look closely at the wound yourself on the second day for early signs of infection listed above. MAKE SURE YOU:   Understand these instructions.   Will watch your condition.   Will get help right away if you are not doing well or get worse.  Document Released: 10/14/2004 Document Revised: 12/24/2010 Document Reviewed: 12/08/2010 [facility] Patient Information 2012 [place], [place].     Contusion A contusion is a deep bruise. Contusions are the result of an injury that caused bleeding under the skin. The contusion may turn blue, purple, or yellow. Minor injuries will give you a painless contusion, but more severe contusions may stay painful and swollen for a few weeks.  CAUSES  A contusion is usually caused by a blow, trauma, or direct force to an area of the body. SYMPTOMS   Swelling and redness of the injured area.   Bruising of the injured area.   Tenderness and soreness of the injured area.   Pain.  DIAGNOSIS  The diagnosis can be made by taking a history and physical exam. An X-ray, CT scan, or MRI may be needed to determine if there were any associated injuries, such as fractures. TREATMENT  Specific treatment will depend on what area of the body was injured. In general, the best treatment for a contusion is resting, icing, elevating, and applying cold compresses to the injured area. Over-the-counter medicines may also be recommended for pain control. Ask your caregiver what the best treatment is for your contusion. HOME CARE INSTRUCTIONS   Put ice on the injured area.   Put ice in a plastic bag.   Place a towel between your skin and the bag.   Leave the ice on for 15 to 20  minutes, 3 to 4 times a day.   Only take over-the-counter or prescription medicines for pain, discomfort, or fever as directed by your caregiver. Your caregiver may recommend avoiding anti-inflammatory medicines (aspirin, ibuprofen, and naproxen) for 48 hours because these medicines may increase bruising.   Rest the injured area.   If possible, elevate the injured area to reduce swelling.  SEEK IMMEDIATE MEDICAL CARE IF:   You have increased bruising or swelling.   You have pain that is getting worse.   Your swelling or pain is not relieved with medicines.  MAKE SURE YOU:   Understand these instructions.   Will watch your condition.   Will get help right away if you are not doing well or get worse.  Document Released: 10/14/2004 Document Revised: 12/24/2010 Document Reviewed: 11/09/2010  Home Safety and Preventing Falls Falls are a leading cause of injury and while they affect all age groups, falls have greater short-term and long-term impact on older age groups. However, falls should not be a part of life or aging. It is possible for individuals and their families to use preventive measures to significantly decrease the likelihood that anyone, especially an older adult, will fall. There are many simple measures which can make your home safer with respect to preventing falls. The following actions can help reduce falls among all members of your family and are especially important as you age, when your balance, lower limb strength, coordination, and eyesight may be declining. The use of preventive measures will help to reduce you and your family's risk of falls and serious medical consequences. OUTDOORS  Repair cracks and edges of walkways and driveways.   Remove high doorway thresholds and trim shrubbery on the main path into your home.   Ensure there is good outside lighting at main entrances and along main walkways.   Clear walkways of tools, rocks, debris, and clutter.     Check that handrails are not broken and are securely fastened. Both sides of steps should have handrails.   In the garage, be attentive to and clean up grease or oil spills on the cement. This can make the surface extremely slippery.   In winter, have leaves, snow, and ice cleared regularly.   Use sand or salt on walkways during winter months.  BATHROOM  Install grab bars by the toilet and in the tub and shower.   Use non-skid mats or decals in the tub or shower.   If unable to easily stand unsupported while showering, place a plastic non slip stool in the shower to sit on when needed.   Install night lights.   Keep floors dry and clean up all water on the floor immediately.   Remove soap buildup in tub or shower on a regular basis.   Secure bath mats with non-slip, double-sided rug tape.   Remove tripping hazards from the floors.  BEDROOMS  Install night lights.   Do not use oversized bedding.   Make sure a bedside light is easy to reach.   Keep a telephone by your bedside.   Make sure that you can get in and out of your bed easily.   Have a firm chair, with side arms, to use for getting dressed.   Remove clutter from around closets.   Store clothing, bed coverings, and other household items where you can reach them comfortably.   Remove tripping hazards from the floor.  LIVING AREAS AND STAIRWAYS  Turn on lights to avoid having to walk through dark areas.   Keep lighting uniform in each room. Place brighter lightbulbs in darker areas, including stairways.   Replace lightbulbs that burn out in stairways immediately.   Arrange furniture to provide for clear pathways.   Keep furniture in the same place.   Eliminate or tape down electrical cables in high traffic areas.   Place handrails on both sides of stairways. Use handrails when going up or down stairs.   Most falls occur on the top or bottom 3 steps.   Fix any loose handrails. Make sure handrails on  both sides of the stairways are as long as the stairs.   Remove all walkway obstacles.   Coil or tape electrical cords off to the side of walking areas and out of the way. If using many extension cords, have  an electrician put in a new wall outlet to reduce or eliminate them.   Make sure spills are cleaned up quickly and allow time for drying before walking on freshly cleaned floors.   Firmly attach carpet with non-skid or two-sided tape.   Keep frequently used items within easy reach.   Remove tripping hazards such as throw rugs and clutter in walkways. Never leave objects on stairs.   Get rid of throw rugs elsewhere if possible.   Eliminate uneven floor surfaces.   Make sure couches and chairs are easy to get into and out of.   Check carpeting to make sure it is firmly attached along stairs.   Make repairs to worn or loose carpet promptly.   Select a carpet pattern that does not visually hide the edge of steps.   Avoid placing throw rugs or scatter rugs at the top or bottom of stairways, or properly secure with carpet tape to prevent slippage.   Have an electrician put in a light switch at the top and bottom of the stairs.   Get light switches that glow.   Avoid the following practices: hurrying, inattention, obscured vision, carrying large loads, and wearing slip-on shoes.   Be aware of all pets.  KITCHEN  Place items that are used frequently, such as dishes and food, within easy reach.   Keep handles on pots and pans toward the center of the stove. Use back burners when possible.   Make sure spills are cleaned up quickly and allow time for drying.   Avoid walking on wet floors.   Avoid hot utensils and knives.   Position shelves so they are not too high or low.   Place commonly used objects within easy reach.   If necessary, use a sturdy step stool with a grab bar when reaching.   Make sure electrical cables are out of the way.   Do not use floor polish or  wax that makes floors slippery.  OTHER HOME FALL PREVENTION STRATEGIES  Wear low heel or rubber sole shoes that are supportive and fit well.   Wear closed toe shoes.   Know and watch for side effects of medications. Have your caregiver or pharmacist look at all your medicines, even over-the-counter medicines. Some medicines can make you sleepy or dizzy.   Exercise regularly. Exercise makes you stronger and improves your balance and coordination.   Limit use of alcohol.   Use eyeglasses if necessary and keep them clean. Have your vision checked every year.   Organize your household in a manner that minimizes the need to walk distances when hurried, or go up and down stairs unnecessarily. For example, have a phone placed on at least each floor of your home. If possible, have a phone beside each sitting or lying area where you spend the most time at home. Keep emergency numbers posted at all phones.   Use non-skid floor wax.   When using a ladder, make sure:   The base is firm.   All ladder feet are on level ground.   The ladder is angled against the wall properly.   When climbing a ladder, face the ladder and hold the ladder rungs firmly.   If reaching, always keep your hips and body weight centered between the rails.   When using a stepladder, make sure it is fully opened and both spreaders are firmly locked.   Do not climb a closed stepladder.   Avoid climbing beyond the second step from the  top of a stepladder and the 4th rung from the top of an extension ladder.   Learn and use mobility aids as needed.   Change positions slowly. Arise slowly from sitting and lying positions. Sit on the edge of your bed before getting to your feet.   If you have a history of falls, ask someone to add color or contrast paint or tape to grab bars and handrails in your home.   If you have a history of falls, ask someone to place contrasting color strips on first and last steps.   Install an  electrical emergency response system if you need one, and know how to use it.   If you have a medical or other condition that causes you to have limited physical strength, it is important that you reach out to family and friends for occasional help.  FOR CHILDREN:  If young children are in the home, use safety gates. At the top of stairs use screw-mounted gates; use pressure-mounted gates for the bottom of the stairs and doorways between rooms.   Young children should be taught to descend stairs on their stomachs, feet first, and later using the handrail.   Keep drawers fully closed to prevent them from being climbed on or pulled out entirely.   Move chairs, cribs, beds and other furniture away from windows.   Consider installing window guards on windows ground floor and up, unless they are emergency fire exits. Make sure they have easy release mechanisms.   Consider installing special locks that only allow the window to be opened to a certain height.   Never rely on window screens to prevent falls.   Never leave babies alone on changing tables, beds or sofas. Use a changing table that has a restraining strap.   When a child can pull to a standing position, the crib mattress should be adjusted to its lowest position. There should be at least 26 inches between the top rails of the crib drop side and the mattress. Toys, bumper pads, and other objects that can be used as steps to climb out should be removed from the crib.   On bunk beds never allow a child under age 78 to sleep on the top bunk. For older children, if the upper bunk is not against a wall, use guard rails on both sides. No matter how old a child is, keep the guard rails in place on the top bunk since children roll during sleep. Do not permit horseplay on bunks.   Grass and soil surfaces beneath backyard playground equipment should be replaced with hardwood chips, shredded wood mulch, sand, pea gravel, rubber, crushed stone, or  another safer material at depths of at least 9 to 12 inches.   When riding bikes or using skates, skateboards, skis, or snowboards, require children to wear helmets. Look for those that have stickers stating that they meet or exceed safety standards.   Vertical posts or pickets in deck, balcony, and stairway railings should be no more than 3 1/2 inches apart if a young baby will have access to the area. The space between horizontal rails or bars, and between the floor and the first horizontal rail or bar, should be no more than 3 1/2 inches.  Document Released: 12/25/2001 Document Revised: 12/24/2010 Document Reviewed: 10/24/2008 [facility] Patient Information 2012 [place], [place]. [employer] Patient Information 2012 [place], [place].

## 2011-07-04 NOTE — Progress Notes (Signed)
Orthopedic Tech Progress Note Patient Details:  Matthew Thornton [birth year] [id number]  Ortho Devices Type of Ortho Device: Sling immobilizer Ortho Device/Splint Interventions: Application   Shawnie Pons 07/04/2011, 9:30 AM

## 2011-07-04 NOTE — ED Provider Notes (Signed)
History     CSN: [id number]  Arrival date & time 07/04/11  3818   First MD Initiated Contact with Patient 07/04/11 [id number]      Chief Complaint  Patient presents with  . Fall    (Consider location/radiation/quality/duration/timing/severity/associated sxs/prior treatment) Patient is a 61 y.o. male presenting with fall. The history is provided by the patient and the spouse.  Fall The accident occurred yesterday. The fall occurred while walking (2 falls- initial occurred when he slipped walking down a hill, second when he tripped walking up steps on porch at home). Distance fallen: from standing. Impact surface: grass initially, then concrete. There was no blood loss. Point of impact: left arm to railing, back. Pain location: left upper arm, right shoulder, lower back. The pain is moderate. He was ambulatory at the scene. There was no entrapment after the fall. There was no drug use involved in the accident. There was no alcohol use involved in the accident.  Denies head injury or LOC. No visual change, CP, SOB, abd pain, n/v, weakness/numbness to extremities. No prior treatment, no medications taken- has run out of naproxen.  Past Medical History  Diagnosis Date  . Hypertension   . Diabetes mellitus   . Cardiomyopathy     Past Surgical History  Procedure Date  . Back surgery     History reviewed. No pertinent family history.  History  Substance Use Topics  . Smoking status: Current Everyday Smoker    Types: Cigarettes  . Smokeless tobacco: Never Used  . Alcohol Use: Yes      Review of Systems 10 systems reviewed and are negative for acute change except as noted in the HPI.  Allergies  Review of patient's allergies indicates no known allergies.  Home Medications   Current Outpatient Rx  Name Route Sig Dispense Refill  . ALBUTEROL SULFATE HFA 108 (90 BASE) MCG/ACT IN AERS Inhalation Inhale 2 puffs into the lungs every 6 (six) hours as needed.      . ATENOLOL 25 MG PO  TABS Oral Take 25 mg by mouth daily.      Marland Kitchen FLUOXETINE HCL 20 MG PO CAPS Oral Take 20 mg by mouth daily.    Marland Kitchen GABAPENTIN 600 MG PO TABS Oral Take 600 mg by mouth 4 (four) times daily.      Marland Kitchen HYDROCHLOROTHIAZIDE 25 MG PO TABS Oral Take 25 mg by mouth daily.      Marland Kitchen LISINOPRIL 40 MG PO TABS Oral Take 40 mg by mouth daily.      Marland Kitchen NAPROXEN 500 MG PO TABS Oral Take 500 mg by mouth 2 (two) times daily with a meal.      . OLANZAPINE 10 MG PO TABS Oral Take 10 mg by mouth at bedtime.    Marland Kitchen RANITIDINE HCL 150 MG PO TABS Oral Take 150 mg by mouth 2 (two) times daily.      Marland Kitchen TERAZOSIN HCL 2 MG PO CAPS Oral Take 2 mg by mouth at bedtime.        BP 126/82  Pulse 67  Temp 98 F (36.7 C) (Oral)  Resp 16  SpO2 95%  Physical Exam  Nursing note reviewed. Constitutional: He is oriented to person, place, and time.       VS reviewed, sig for HTN  Cardiovascular: Normal rate and regular rhythm.        Bilateral radial and DP pulses are 2+   Pulmonary/Chest: Effort normal and breath sounds normal. No respiratory distress. He has  no wheezes. He exhibits no tenderness.  Abdominal: Soft. Bowel sounds are normal. He exhibits no distension. There is no tenderness.  Musculoskeletal:       Left shoulder: Normal.       Right elbow: Normal.      Right shoulder: ROM limited. Strength difficult to assess given resistance to examination, appears to be 3/5 in all fields with only small movements attempted, makes no attempt to move against resistance. Tenderness globally over shoulder. No deformity seen. No pain to palpation over the clavicle. Left forearm with bruising as documented in skin exam, no tenderness to palpation of the entire extremity. Strength preserved to the bicep. Bilateral knees with mild TTP globally with preserved ROM, no deformity, no overlying wounds or skin changes. Strength preserved. No pain to CS, TS. Midline and paravertebral pain to palpation over lower lumbar spine with no stepoff or  deformity. Overlying well-healed surgical scar seen.   Neurological: He is alert and oriented to person, place, and time. Cranial nerve deficit: 3-12 intact.       Antalgic gait without ataxia. Sensation intact to light touch in all extremities distally. Bilateral patellar reflexes 2+.  Skin: Skin is warm and dry.       Ecchymosis over left bicep with abrasion, no active bleeding, minimal surrounding erythema, no wound drainage. Abrasions to bilateral anterior lower legs. Right approx 5mm, minimal surrounding erythema with no bleeding, wound drainage, or induration. Left approx 1cm, no surrounding erythema, no bleeding, wound drainage, induration.     ED Course  Procedures (including critical care time)  Labs Reviewed - No data to display Dg Lumbar Spine Complete  07/04/2011  *RADIOLOGY REPORT*  Clinical Data: Fall.  Back pain.  Prior lumbar surgery.  LUMBAR SPINE - COMPLETE 4+ VIEW  Comparison: None.  Findings: Significant L4-5 disc degeneration, disc space narrowing and osteophyte formation.  Mild L5-S1 disc space narrowing.  Facet joint degenerative changes L4-5 and L5-S1.  Small Schmorl's node deformity L3-4.  Minimal spurring superior aspect T12 without discrete lumbar or lower thoracic spine fracture noted.  Vascular calcifications.  Caliber of the aorta incompletely assessed.  IMPRESSION: No acute fracture noted as discussed above.  Significant degenerative changes.  Calcified aorta and branch vessels.  Caliber of the aorta are not adequately assessed.  Original Report Authenticated By: Fuller Canada, M.D.   Dg Shoulder Right  07/04/2011  *RADIOLOGY REPORT*  Clinical Data: Post fall, now with shoulder pain  RIGHT SHOULDER - 2+ VIEW  Comparison: None.  Findings: No fracture or dislocation.  No significant degenerative change of the glenohumeral joint.  There is mild to moderate degenerative change of the [facility] joint with joint space loss, subchondral sclerosis and inferiorly-directed  osteophytosis. No evidence of calcific tendonitis.  Regional soft tissues are normal. Limited visualization of the adjacent thorax is normal.  IMPRESSION: 1.  No fracture or dislocation. 2.  Mild to moderate degenerative changes of the [facility] joint.  Original Report Authenticated By: Waynard Reeds, M.D.     1. Fall   2. Right shoulder injury   3. Contusion of left arm   4. Abrasion, leg without infection       MDM  Mechanical fall x 2. No head injury or LOC. Injuries described above to bilateral upper extremities, lower back. Imaging of right shoulder, L-spine warranted and show no acute findings.  No back pain red flags ro N/V deficit on exam. Concern for rotator cuff injury vs poor effort on exam - shoulder  immobilizer placed in ED with instructions for ortho f/u if not improving appropriately. Left arm contusion without pain or other PE abnormality to the area- no imaging warranted. Discussed wound care with pt and spouse, return precautions discussed.         Shaaron Adler, [place] 07/04/11 [phone]

## 2011-07-04 NOTE — ED Notes (Addendum)
Patient reports that he lost his balance/tripped twice yesterday and fell.  Patient reports having "bad knees".  Patient fell in grass one time; second time, patient fell on the porch -- patient tried to catch himself both times.  Complaining of pain in right shoulder; denies passing out or hitting head during both falls.

## 2011-07-04 NOTE — ED Notes (Signed)
Abrasions to bilateral shins, cleansed  With NSS and bacitracin applied. Abrasion to his rt. Lower shin has mild redness noted. No warmth or drainage noted.  Lt. Shin abrasions have no redness, drainage or swelling noted.

## 2011-07-06 NOTE — ED Provider Notes (Signed)
Medical screening examination/treatment/procedure(s) were performed by non-physician practitioner and as supervising physician I was immediately available for consultation/collaboration.  Toneka Fullen R. Jode Lippe, MD 07/06/11 1459 

## 2012-10-14 ENCOUNTER — Encounter (HOSPITAL_COMMUNITY): Payer: Self-pay | Admitting: Emergency Medicine

## 2012-10-14 ENCOUNTER — Inpatient Hospital Stay (HOSPITAL_COMMUNITY): Payer: Non-veteran care

## 2012-10-14 ENCOUNTER — Inpatient Hospital Stay (HOSPITAL_COMMUNITY)
Admission: EM | Admit: 2012-10-14 | Discharge: 2012-10-17 | DRG: 065 | Disposition: A | Discharge status: Home Health | Payer: Non-veteran care | Attending: Internal Medicine | Admitting: Internal Medicine

## 2012-10-14 ENCOUNTER — Emergency Department (HOSPITAL_COMMUNITY): Payer: Non-veteran care

## 2012-10-14 DIAGNOSIS — R32 Unspecified urinary incontinence: Secondary | ICD-10-CM | POA: Diagnosis present

## 2012-10-14 DIAGNOSIS — E785 Hyperlipidemia, unspecified: Secondary | ICD-10-CM

## 2012-10-14 DIAGNOSIS — F1490 Cocaine use, unspecified, uncomplicated: Secondary | ICD-10-CM

## 2012-10-14 DIAGNOSIS — F17200 Nicotine dependence, unspecified, uncomplicated: Secondary | ICD-10-CM | POA: Diagnosis present

## 2012-10-14 DIAGNOSIS — Z91199 Patient's noncompliance with other medical treatment and regimen due to unspecified reason: Secondary | ICD-10-CM

## 2012-10-14 DIAGNOSIS — G40909 Epilepsy, unspecified, not intractable, without status epilepticus: Secondary | ICD-10-CM | POA: Insufficient documentation

## 2012-10-14 DIAGNOSIS — J4489 Other specified chronic obstructive pulmonary disease: Secondary | ICD-10-CM | POA: Diagnosis present

## 2012-10-14 DIAGNOSIS — B1920 Unspecified viral hepatitis C without hepatic coma: Secondary | ICD-10-CM | POA: Diagnosis present

## 2012-10-14 DIAGNOSIS — R29810 Facial weakness: Secondary | ICD-10-CM | POA: Diagnosis present

## 2012-10-14 DIAGNOSIS — I63511 Cerebral infarction due to unspecified occlusion or stenosis of right middle cerebral artery: Secondary | ICD-10-CM | POA: Diagnosis present

## 2012-10-14 DIAGNOSIS — J449 Chronic obstructive pulmonary disease, unspecified: Secondary | ICD-10-CM | POA: Diagnosis present

## 2012-10-14 DIAGNOSIS — F1410 Cocaine abuse, uncomplicated: Secondary | ICD-10-CM | POA: Diagnosis present

## 2012-10-14 DIAGNOSIS — I441 Atrioventricular block, second degree: Secondary | ICD-10-CM | POA: Diagnosis present

## 2012-10-14 DIAGNOSIS — I639 Cerebral infarction, unspecified: Secondary | ICD-10-CM

## 2012-10-14 DIAGNOSIS — I6381 Other cerebral infarction due to occlusion or stenosis of small artery: Secondary | ICD-10-CM | POA: Diagnosis present

## 2012-10-14 DIAGNOSIS — Z9119 Patient's noncompliance with other medical treatment and regimen: Secondary | ICD-10-CM

## 2012-10-14 DIAGNOSIS — G8190 Hemiplegia, unspecified affecting unspecified side: Secondary | ICD-10-CM | POA: Diagnosis present

## 2012-10-14 DIAGNOSIS — I10 Essential (primary) hypertension: Secondary | ICD-10-CM | POA: Diagnosis present

## 2012-10-14 DIAGNOSIS — Z720 Tobacco use: Secondary | ICD-10-CM | POA: Diagnosis present

## 2012-10-14 DIAGNOSIS — I428 Other cardiomyopathies: Secondary | ICD-10-CM | POA: Diagnosis present

## 2012-10-14 DIAGNOSIS — Z66 Do not resuscitate: Secondary | ICD-10-CM | POA: Diagnosis present

## 2012-10-14 DIAGNOSIS — Z23 Encounter for immunization: Secondary | ICD-10-CM

## 2012-10-14 DIAGNOSIS — E119 Type 2 diabetes mellitus without complications: Secondary | ICD-10-CM | POA: Diagnosis present

## 2012-10-14 DIAGNOSIS — Z79899 Other long term (current) drug therapy: Secondary | ICD-10-CM

## 2012-10-14 DIAGNOSIS — R1310 Dysphagia, unspecified: Secondary | ICD-10-CM | POA: Diagnosis present

## 2012-10-14 DIAGNOSIS — I6350 Cerebral infarction due to unspecified occlusion or stenosis of unspecified cerebral artery: Principal | ICD-10-CM | POA: Diagnosis present

## 2012-10-14 DIAGNOSIS — R4789 Other speech disturbances: Secondary | ICD-10-CM | POA: Diagnosis present

## 2012-10-14 HISTORY — DX: Unspecified convulsions: R56.9

## 2012-10-14 HISTORY — DX: Other cerebral infarction due to occlusion or stenosis of small artery: I63.81

## 2012-10-14 HISTORY — DX: Unspecified viral hepatitis C without hepatic coma: B19.20

## 2012-10-14 HISTORY — DX: Prediabetes: R73.03

## 2012-10-14 HISTORY — DX: Tobacco use: Z72.0

## 2012-10-14 HISTORY — DX: Cocaine abuse, uncomplicated: F14.10

## 2012-10-14 LAB — COMPREHENSIVE METABOLIC PANEL
ALT: 28 U/L (ref 0–53)
AST: 31 U/L (ref 0–37)
Alkaline Phosphatase: 101 U/L (ref 39–117)
CO2: 24 mEq/L (ref 19–32)
Chloride: 102 mEq/L (ref 96–112)
Creatinine, Ser: 1.1 mg/dL (ref 0.50–1.35)
GFR calc non Af Amer: 69 mL/min — ABNORMAL LOW (ref >90)
Glucose, Bld: 138 mg/dL — ABNORMAL HIGH (ref 70–99)
Potassium: 4.4 mEq/L (ref 3.5–5.1)
Sodium: 136 mEq/L (ref 135–145)
Total Bilirubin: 0.7 mg/dL (ref 0.3–1.2)

## 2012-10-14 LAB — URINALYSIS, ROUTINE W REFLEX MICROSCOPIC
Hgb urine dipstick: NEGATIVE
Nitrite: NEGATIVE
Protein, ur: NEGATIVE mg/dL
Specific Gravity, Urine: 1.004 — ABNORMAL LOW (ref 1.005–1.030)
Urobilinogen, UA: 1 mg/dL (ref 0.0–1.0)
pH: 6 (ref 5.0–8.0)

## 2012-10-14 LAB — DIFFERENTIAL
Basophils Absolute: 0.1 10*3/uL (ref 0.0–0.1)
Eosinophils Absolute: 0.3 10*3/uL (ref 0.0–0.7)
Lymphocytes Relative: 43 % (ref 12–46)
Monocytes Absolute: 0.5 10*3/uL (ref 0.1–1.0)
Neutro Abs: 2.4 10*3/uL (ref 1.7–7.7)
Neutrophils Relative %: 43 % (ref 43–77)

## 2012-10-14 LAB — POCT I-STAT, CHEM 8
Calcium, Ion: 1.18 mmol/L (ref 1.13–1.30)
Chloride: 106 mEq/L (ref 96–112)
Creatinine, Ser: 1.1 mg/dL (ref 0.50–1.35)
HCT: 54 % — ABNORMAL HIGH (ref 39.0–52.0)
Hemoglobin: 18.4 g/dL — ABNORMAL HIGH (ref 13.0–17.0)
Potassium: 4.3 mEq/L (ref 3.5–5.1)
Sodium: 141 mEq/L (ref 135–145)
TCO2: 25 mmol/L (ref 0–100)

## 2012-10-14 LAB — CBC
HCT: 49.1 % (ref 39.0–52.0)
Hemoglobin: 16.7 g/dL (ref 13.0–17.0)
MCH: 25.2 pg — ABNORMAL LOW (ref 26.0–34.0)
RDW: 15.8 % — ABNORMAL HIGH (ref 11.5–15.5)
WBC: 5.6 10*3/uL (ref 4.0–10.5)

## 2012-10-14 LAB — GLUCOSE, CAPILLARY
Glucose-Capillary: 154 mg/dL — ABNORMAL HIGH (ref 70–99)
Glucose-Capillary: 115 mg/dL — ABNORMAL HIGH (ref 70–99)

## 2012-10-14 LAB — PROTIME-INR
INR: 1.03 (ref 0.00–1.49)
Prothrombin Time: 13.3 seconds (ref 11.6–15.2)

## 2012-10-14 LAB — ETHANOL: Alcohol, Ethyl (B): <11 mg/dL (ref 0–11)

## 2012-10-14 LAB — RAPID URINE DRUG SCREEN, HOSP PERFORMED
Barbiturates: NOT DETECTED
Cocaine: POSITIVE — AB
Opiates: NOT DETECTED
Tetrahydrocannabinol: POSITIVE — AB

## 2012-10-14 LAB — APTT: aPTT: 35 seconds (ref 24–37)

## 2012-10-14 LAB — POCT I-STAT TROPONIN I

## 2012-10-14 MED ORDER — HYDRALAZINE HCL 20 MG/ML IJ SOLN
5.0000 mg | Freq: Once | INTRAMUSCULAR | Status: AC
  Administered 2012-10-14: 5 mg via INTRAVENOUS
  Filled 2012-10-14: qty 1

## 2012-10-14 MED ORDER — HYDRALAZINE HCL 20 MG/ML IJ SOLN: 5.0000 mg | Freq: Once | INTRAMUSCULAR | Status: DC

## 2012-10-14 MED ORDER — INSULIN ASPART 100 UNIT/ML ~~LOC~~ SOLN
0.0000 [IU] | SUBCUTANEOUS | Status: DC
  Administered 2012-10-15: 1 [IU] via SUBCUTANEOUS
  Administered 2012-10-16 – 2012-10-17 (×2): 2 [IU] via SUBCUTANEOUS
  Administered 2012-10-17: 3 [IU] via SUBCUTANEOUS

## 2012-10-14 MED ORDER — ASPIRIN 300 MG RE SUPP
300.0000 mg | Freq: Every day | RECTAL | Status: DC
  Administered 2012-10-14 – 2012-10-15 (×2): 300 mg via RECTAL
  Filled 2012-10-14 (×5): qty 1

## 2012-10-14 MED ORDER — HYDROCOD POLST-CHLORPHEN POLST 10-8 MG/5ML PO LQCR: 5.0000 mL | Freq: Two times a day (BID) | ORAL | Status: DC | PRN

## 2012-10-14 MED ORDER — INFLUENZA VAC SPLIT QUAD 0.5 ML IM SUSP
0.5000 mL | INTRAMUSCULAR | Status: AC
  Administered 2012-10-15: 0.5 mL via INTRAMUSCULAR
  Filled 2012-10-14: qty 0.5

## 2012-10-14 MED ORDER — HEPARIN SODIUM (PORCINE) 5000 UNIT/ML IJ SOLN
5000.0000 [IU] | Freq: Three times a day (TID) | INTRAMUSCULAR | Status: DC
  Administered 2012-10-14 – 2012-10-17 (×9): 5000 [IU] via SUBCUTANEOUS
  Filled 2012-10-14 (×12): qty 1

## 2012-10-14 MED ORDER — ASPIRIN 325 MG PO TABS
325.0000 mg | ORAL_TABLET | Freq: Every day | ORAL | Status: DC
  Administered 2012-10-16 – 2012-10-17 (×2): 325 mg via ORAL
  Filled 2012-10-14 (×3): qty 1

## 2012-10-14 MED ORDER — SODIUM CHLORIDE 0.9 % IV SOLN
INTRAVENOUS | Status: DC
  Administered 2012-10-14 – 2012-10-15 (×3): via INTRAVENOUS

## 2012-10-14 MED ORDER — HYDRALAZINE HCL 20 MG/ML IJ SOLN
2.0000 mg | Freq: Once | INTRAMUSCULAR | Status: AC
  Administered 2012-10-14: 2 mg via INTRAVENOUS
  Filled 2012-10-14: qty 1

## 2012-10-14 NOTE — ED Notes (Signed)
Patient transported to CT 

## 2012-10-14 NOTE — Consult Note (Signed)
Referring Physician: Effie Shy    Chief Complaint: Left sided weakness, difficulty with swallowing  HPI: Matthew Thornton is an 62 y.o. male that reports that he did well yesterday. Last evening while watching television began to eat chips and was unable to swallow.  When he started to drink was unable to handle the fluid in his mouth.  This morning his symptoms had not improved and the patient presented for evaluation.    Date last known well: Date: 10/13/2012 Time last known well: Time: 21:00 tPA Given: No: Outside time window  Past Medical History  Diagnosis Date  . Hypertension   . Diabetes mellitus   . Cardiomyopathy    Seizure disorder off medications with no recurrence of seizures.    Past Surgical History  Procedure Laterality Date  . Back surgery      Family history:  Mother and father are deceased.  Mother had multiple strokes.  Father died of an accident.  Has one sister that has diabetes.  Social History:  reports that he has been smoking Cigarettes.  He has been smoking about 0.00 packs per day. He has never used smokeless tobacco. He reports that  drinks alcohol. He reports that he does not use illicit drugs.  Allergies: No Known Allergies  Medications: I have reviewed the patient's current medications. Prior to Admission:  Current outpatient prescriptions: albuterol (PROVENTIL HFA;VENTOLIN HFA) 108 (90 BASE) MCG/ACT inhaler, Inhale 2 puffs into the lungs every 6 (six) hours as needed.  , Disp: , Rfl: ;   atenolol (TENORMIN) 25 MG tablet, Take 25 mg by mouth daily.  , Disp: , Rfl: ;   gabapentin (NEURONTIN) 600 MG tablet, Take 600 mg by mouth 4 (four) times daily.  , Disp: , Rfl:  hydrochlorothiazide (HYDRODIURIL) 25 MG tablet, Take 25 mg by mouth daily.  , Disp: , Rfl: ;   lisinopril (PRINIVIL,ZESTRIL) 40 MG tablet, Take 40 mg by mouth daily.  , Disp: , Rfl: ;   mirtazapine (REMERON) 15 MG tablet, Take 15 mg by mouth at bedtime., Disp: , Rfl: ;   naproxen (NAPROSYN)  500 MG tablet, Take 500 mg by mouth 2 (two) times daily as needed (for pain)., Disp: , Rfl:  ranitidine (ZANTAC) 150 MG tablet, Take 150 mg by mouth 2 (two) times daily.  , Disp: , Rfl: ;   terazosin (HYTRIN) 2 MG capsule, Take 2 mg by mouth at bedtime.  , Disp: , Rfl: ;   valACYclovir (VALTREX) 500 MG tablet, Take 500 mg by mouth 2 (two) times daily., Disp: , Rfl:   ROS: History obtained from the patient  General ROS: negative for - chills, fatigue, fever, night sweats, weight gain or weight loss Psychological ROS: negative for - behavioral disorder, hallucinations, memory difficulties, mood swings or suicidal ideation Ophthalmic ROS: negative for - blurry vision, double vision, eye pain or loss of vision ENT ROS: negative for - epistaxis, nasal discharge, oral lesions, sore throat, tinnitus or vertigo Allergy and Immunology ROS: negative for - hives or itchy/watery eyes Hematological and Lymphatic ROS: negative for - bleeding problems, bruising or swollen lymph nodes Endocrine ROS: negative for - galactorrhea, hair pattern changes, polydipsia/polyuria or temperature intolerance Respiratory ROS: negative for - cough, hemoptysis, shortness of breath or wheezing Cardiovascular ROS: negative for - chest pain, dyspnea on exertion, edema or irregular heartbeat Gastrointestinal ROS: negative for - abdominal pain, diarrhea, hematemesis, nausea/vomiting or stool incontinence Genito-Urinary ROS: negative for - dysuria, hematuria, incontinence or urinary frequency/urgency Musculoskeletal ROS: negative  for - joint swelling or muscular weakness Neurological ROS: as noted in HPI Dermatological ROS: negative for rash and skin lesion changes  Physical Examination: Blood pressure 173/106, pulse 58, temperature 98.1 F (36.7 C), temperature source Oral, resp. rate 17, SpO2 99.00%.  Neurologic Examination: Mental Status: Alert, oriented, thought content appropriate.  Speech fluent but slurred.  Able to  follow 3 step commands without difficulty. Cranial Nerves: II: Discs flat bilaterally; Visual fields grossly normal, pupils equal, round, reactive to light and accommodation III,IV, VI: ptosis not present, extra-ocular motions intact bilaterally but patient gaze preference is to the right V,VII: left facial droop, facial light touch sensation normal bilaterally VIII: hearing normal bilaterally IX,X: gag reflex reduced XI: bilateral shoulder shrug XII: midline tongue extension Motor: Right : Upper extremity   5/5    Left:     Upper extremity   4/5  Lower extremity   5/5     Lower extremity   4/5 Tone and bulk:normal tone throughout; no atrophy noted Sensory: Pinprick and light touch intact throughout, bilaterally Deep Tendon Reflexes: 2+ in the upper extremities, trace at the knees and absent at the ankles Plantars: Right: mute   Left: mute Cerebellar: normal finger-to-nose and normal heel-to-shin test Gait: unable to test CV: pulses palpable throughout   Laboratory Studies:  Basic Metabolic Panel:  Recent Labs Lab 10/14/12 0831 10/14/12 0851  NA 136 141  K 4.4 4.3  CL 102 106  CO2 24  --   GLUCOSE 138* 143*  BUN 9 9  CREATININE 1.10 1.10  CALCIUM 9.2  --     Liver Function Tests:  Recent Labs Lab 10/14/12 0831  AST 31  ALT 28  ALKPHOS 101  BILITOT 0.7  PROT 7.7  ALBUMIN 3.7   No results found for this basename: LIPASE, AMYLASE,  in the last 168 hours No results found for this basename: AMMONIA,  in the last 168 hours  CBC:  Recent Labs Lab 10/14/12 0831 10/14/12 0851  WBC 5.6  --   NEUTROABS 2.4  --   HGB 16.7 18.4*  HCT 49.1 54.0*  MCV 74.1*  --   PLT 132*  --     Cardiac Enzymes:  Recent Labs Lab 10/14/12 0831  TROPONINI <0.30    BNP: No components found with this basename: POCBNP,   CBG:  Recent Labs Lab 10/14/12 0905  GLUCAP 154*    Microbiology: Results for orders placed during the hospital encounter of 11/21/07  URINE  CULTURE     Status: None   Collection Time    11/15/07  1:46 PM      Result Value Range Status   Specimen Description URINE, CLEAN CATCH   Final   Special Requests NONE   Final   Colony Count NO GROWTH   Final   Culture NO GROWTH   Final   Report Status 11/17/2007 FINAL   Final    Coagulation Studies:  Recent Labs  10/14/12 0831  LABPROT 13.3  INR 1.03    Urinalysis:  Recent Labs Lab 10/14/12 0908  COLORURINE YELLOW  LABSPEC 1.004*  PHURINE 6.0  GLUCOSEU NEGATIVE  HGBUR NEGATIVE  BILIRUBINUR NEGATIVE  KETONESUR NEGATIVE  PROTEINUR NEGATIVE  UROBILINOGEN 1.0  NITRITE NEGATIVE  LEUKOCYTESUR SMALL*    Lipid Panel: No results found for this basename: chol, trig, hdl, cholhdl, vldl, ldlcalc    HgbA1C:  No results found for this basename: HGBA1C    Urine Drug Screen:     Component Value Date/Time  LABOPIA NONE DETECTED 10/14/2012 0908   LABOPIA NEGATIVE 11/15/2007 1346   COCAINSCRNUR POSITIVE* 10/14/2012 0908   COCAINSCRNUR NEGATIVE 11/15/2007 1346   LABBENZ NONE DETECTED 10/14/2012 0908   LABBENZ NEGATIVE 11/15/2007 1346   AMPHETMU NONE DETECTED 10/14/2012 0908   AMPHETMU NEGATIVE 11/15/2007 1346   THCU POSITIVE* 10/14/2012 0908   LABBARB NONE DETECTED 10/14/2012 0908    Alcohol Level:  Recent Labs Lab 10/14/12 0831  ETH <11    Other results: EKG: sinus rhythm at 52 bpm with premature atrial complexes and evidence of LVH.  Imaging: Ct Head Wo Contrast  10/14/2012   CLINICAL DATA:  Mild left upper extremity weakness  EXAM: CT HEAD WITHOUT CONTRAST  TECHNIQUE: Contiguous axial images were obtained from the base of the skull through the vertex without intravenous contrast.  COMPARISON:  09/01/2007  FINDINGS: No evidence of parenchymal hemorrhage or extra-axial fluid collection. No mass lesion, mass effect, or midline shift.  No CT evidence of acute infarction.  Extensive small vessel ischemic changes with bilateral basal ganglia lacunar infarcts.  The  visualized paranasal sinuses are essentially clear. The mastoid air cells are unopacified.  No evidence of calvarial fracture.  IMPRESSION: No evidence of acute intracranial abnormality.  Extensive small vessel ischemic changes with bilateral basal ganglia lacunar infarcts.   Electronically Signed   By: Charline Bills M.D.   On: 10/14/2012 10:10    Assessment: 62 y.o. male presenting with new onset left sided weakness and slurred speech.  Patient on no antiplatelet therapy at home and poorly compliant with medications.  CT reviewed and shows evidence of chronic infarcts but no acute events.  Acute infarct suspected.  Patient with multiple small vessel risk factors.    Stroke Risk Factors - diabetes mellitus, hypertension and smoking  Plan: 1. HgbA1c, fasting lipid panel 2. MRI, MRA  of the brain without contrast 3. PT consult, OT consult, Speech consult 4. Echocardiogram 5. Carotid dopplers 6. Prophylactic therapy-Antiplatelet med: Aspirin - dose 300 mg rectally 7. Risk factor modification 8. Telemetry monitoring 9. Frequent neuro checks  Case discussed with Dr. Courtney Heys, MD [facility] [phone] 10/14/2012, 11:25 AM
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
## 2012-10-14 NOTE — ED Provider Notes (Addendum)
CSN: [id number]     Arrival date & time 10/14/12  4098 History   First MD Initiated Contact with Patient 10/14/12 0825     Chief Complaint  Patient presents with  . Cerebrovascular Accident   (Consider location/radiation/quality/duration/timing/severity/associated sxs/prior Treatment) HPI Comments: Matthew Thornton is a 62 y.o. Male here for evaluation of "stroke". He came by EMS. He noticed trouble drinking, secondary to, liquids, draining from left side of his mouth while drinking at 9 PM last night. Then, today, his wife thought that he was having trouble walking. He is able to walk to the bathroom, and back. She also reported that he had slurred speech. On arrival to the emergency department. He is able to give history. He does not have additional complaints. He denies headache, paresthesias, chest pain, nausea, vomiting, dizziness, change in bowel or urinary habits. No head trauma. He did not eat this morning. He, states he is compliant with his medications. His PCP is "[facility] [place]". There are no other known modifying factors.   Patient is a 62 y.o. male presenting with Acute Neurological Problem. The history is provided by the patient.  Cerebrovascular Accident    Past Medical History  Diagnosis Date  . Hypertension   . Diabetes mellitus   . Cardiomyopathy    Past Surgical History  Procedure Laterality Date  . Back surgery     History reviewed. No pertinent family history. History  Substance Use Topics  . Smoking status: Current Every Day Smoker    Types: Cigarettes  . Smokeless tobacco: Never Used  . Alcohol Use: Yes    Review of Systems  All other systems reviewed and are negative.    Allergies  Review of patient's allergies indicates no known allergies.  Home Medications   Current Outpatient Rx  Name  Route  Sig  Dispense  Refill  . albuterol (PROVENTIL HFA;VENTOLIN HFA) 108 (90 BASE) MCG/ACT inhaler   Inhalation   Inhale 2 puffs into the lungs every 6 (six) hours  as needed.           Marland Kitchen atenolol (TENORMIN) 25 MG tablet   Oral   Take 25 mg by mouth daily.           Marland Kitchen gabapentin (NEURONTIN) 600 MG tablet   Oral   Take 600 mg by mouth 4 (four) times daily.           . hydrochlorothiazide (HYDRODIURIL) 25 MG tablet   Oral   Take 25 mg by mouth daily.           Marland Kitchen lisinopril (PRINIVIL,ZESTRIL) 40 MG tablet   Oral   Take 40 mg by mouth daily.           . mirtazapine (REMERON) 15 MG tablet   Oral   Take 15 mg by mouth at bedtime.         . naproxen (NAPROSYN) 500 MG tablet   Oral   Take 500 mg by mouth 2 (two) times daily as needed (for pain).         . ranitidine (ZANTAC) 150 MG tablet   Oral   Take 150 mg by mouth 2 (two) times daily.           Marland Kitchen terazosin (HYTRIN) 2 MG capsule   Oral   Take 2 mg by mouth at bedtime.           . valACYclovir (VALTREX) 500 MG tablet   Oral   Take 500 mg by mouth 2 (  two) times daily.          BP 173/106  Pulse 58  Temp(Src) 98.1 F (36.7 C) (Oral)  Resp 17  SpO2 99% Physical Exam  Nursing note and vitals reviewed. Constitutional: He is oriented to person, place, and time. He appears well-developed and well-nourished.  HENT:  Head: Normocephalic and atraumatic.  Right Ear: External ear normal.  Left Ear: External ear normal.  Eyes: Conjunctivae and EOM are normal. Pupils are equal, round, and reactive to light.  Neck: Normal range of motion and phonation normal. Neck supple.  Cardiovascular: Normal rate, regular rhythm, normal heart sounds and intact distal pulses.   Pulmonary/Chest: Effort normal and breath sounds normal. He exhibits no bony tenderness.  Abdominal: Soft. Normal appearance. There is no tenderness.  Musculoskeletal: Normal range of motion.  Neurological: He is alert and oriented to person, place, and time. He has normal strength. No cranial nerve deficit or sensory deficit. He exhibits normal muscle tone. Coordination normal.  No dysarthria or aphasia. Mild  dysmetria, left arm and leg as compared to right. Trace less left strength left arm, as compared to right. Mild, left face droop. No nystagmus, or neglect.  Skin: Skin is warm, dry and intact.  Psychiatric: He has a normal mood and affect. His behavior is normal. Judgment and thought content normal.    ED Course  Procedures (including critical care time)  0830- likely acute right brain stroke, not meeting criteria for Code Stroke. Will elevate, initially with CT imaging to rule out bleeding and then proceed, with additional stroke workup.    Medications - No data to display  Patient Vitals for the past 24 hrs:  BP Temp Temp src Pulse Resp SpO2  10/14/12 0930 173/106 mmHg - - 58 17 99 %  10/14/12 0915 163/96 mmHg - - 60 16 99 %  10/14/12 0900 154/75 mmHg - - 73 17 100 %  10/14/12 0845 169/95 mmHg - - 62 17 98 %  10/14/12 0834 - 98.1 F (36.7 C) - - - -  10/14/12 0827 178/98 mmHg 98.1 F (36.7 C) Oral 62 21 99 %   10:55 AM Reevaluation with update and discussion. After initial assessment and treatment, an updated evaluation reveals no change in physical examination. Wife is with him nowFindings discussed, and questions answered. Nichole Neyer L    10:10 AM-Consult complete with Dr Thad Ranger, Neurohospitalist. Patient case explained and discussed. She  agrees to see patient as Research scientist (medical), for further evaluation and treatment. Call ended at 1013   10:53 AM-Consult complete with Resident on call. Patient case explained and discussed. She agrees to admit patient for further evaluation and treatment. Call ended at 1115  CRITICAL CARE Performed by: Flint Melter Total critical care time: 35 minutes Critical care time was exclusive of separately billable procedures and treating other patients. Critical care was necessary to treat or prevent imminent or life-threatening deterioration. Critical care was time spent personally by me on the following activities: development of treatment plan  with patient and/or surrogate as well as nursing, discussions with consultants, evaluation of patient's response to treatment, examination of patient, obtaining history from patient or surrogate, ordering and performing treatments and interventions, ordering and review of laboratory studies, ordering and review of radiographic studies, pulse oximetry and re-evaluation of patient's condition.    Date: 08/13/12  Rate: 52  Rhythm: sinus bradycardia  QRS Axis: normal  PR and QT Intervals: normal  ST/T Wave abnormalities: early repolarization  PR and QRS Conduction Disutrbances:none  Narrative Interpretation:   Old EKG Reviewed: none available   Labs Review Labs Reviewed  CBC - Abnormal; Notable for the following:    RBC 6.63 (*)    MCV 74.1 (*)    MCH 25.2 (*)    RDW 15.8 (*)    Platelets 132 (*)    All other components within normal limits  DIFFERENTIAL - Abnormal; Notable for the following:    Eosinophils Relative 6 (*)    All other components within normal limits  COMPREHENSIVE METABOLIC PANEL - Abnormal; Notable for the following:    Glucose, Bld 138 (*)    GFR calc non Af Amer 69 (*)    GFR calc Af Amer 80 (*)    All other components within normal limits  URINE RAPID DRUG SCREEN (HOSP PERFORMED) - Abnormal; Notable for the following:    Cocaine POSITIVE (*)    Tetrahydrocannabinol POSITIVE (*)    All other components within normal limits  URINALYSIS, ROUTINE W REFLEX MICROSCOPIC - Abnormal; Notable for the following:    APPearance CLOUDY (*)    Specific Gravity, Urine 1.004 (*)    Leukocytes, UA SMALL (*)    All other components within normal limits  GLUCOSE, CAPILLARY - Abnormal; Notable for the following:    Glucose-Capillary 154 (*)    All other components within normal limits  POCT I-STAT, CHEM 8 - Abnormal; Notable for the following:    Glucose, Bld 143 (*)    Hemoglobin 18.4 (*)    HCT 54.0 (*)    All other components within normal limits  ETHANOL  PROTIME-INR   APTT  TROPONIN I  URINE MICROSCOPIC-ADD ON  POCT I-STAT TROPONIN I   Imaging Review Ct Head Wo Contrast  10/14/2012   CLINICAL DATA:  Mild left upper extremity weakness  EXAM: CT HEAD WITHOUT CONTRAST  TECHNIQUE: Contiguous axial images were obtained from the base of the skull through the vertex without intravenous contrast.  COMPARISON:  09/01/2007  FINDINGS: No evidence of parenchymal hemorrhage or extra-axial fluid collection. No mass lesion, mass effect, or midline shift.  No CT evidence of acute infarction.  Extensive small vessel ischemic changes with bilateral basal ganglia lacunar infarcts.  The visualized paranasal sinuses are essentially clear. The mastoid air cells are unopacified.  No evidence of calvarial fracture.  IMPRESSION: No evidence of acute intracranial abnormality.  Extensive small vessel ischemic changes with bilateral basal ganglia lacunar infarcts.   Electronically Signed   By: Charline Bills M.D.   On: 10/14/2012 10:10    MDM   1. CVA (cerebral infarction)   2. Hypertension   3. Cocaine abuse    CVA, associated with cocaine abuse. Mild hypertension without hypertensive urgency. Since he has an acute stroke, while not urgently, lower blood pressure.  Nursing Notes Reviewed/ Care Coordinated, and agree without changes. Applicable Imaging Reviewed.  Interpretation of Laboratory Data incorporated into ED treatment   Plan: Admit as unassigned patient to the medicine service.    Flint Melter, MD 10/14/12 1131  Flint Melter, MD 10/14/12 [phone]

## 2012-10-14 NOTE — ED Notes (Signed)
Admitting MD at bedside.

## 2012-10-14 NOTE — ED Notes (Signed)
Pt arrives to ed via [facility] for "stroke like symptoms" onset yesterday at 2100.  Pt sts difficulty drinking koolaide.  Pt has mild left sided weakness in LUE only.   Pt caox4, pmsx4, nad.  Pt denies recent illness/injury, or recent changes in meds/diet.

## 2012-10-14 NOTE — ED Notes (Signed)
Attempted report 

## 2012-10-15 ENCOUNTER — Inpatient Hospital Stay (HOSPITAL_COMMUNITY): Payer: Non-veteran care

## 2012-10-15 DIAGNOSIS — I359 Nonrheumatic aortic valve disorder, unspecified: Secondary | ICD-10-CM

## 2012-10-15 DIAGNOSIS — E785 Hyperlipidemia, unspecified: Secondary | ICD-10-CM

## 2012-10-15 LAB — BASIC METABOLIC PANEL
BUN: 11 mg/dL (ref 6–23)
Creatinine, Ser: 1.1 mg/dL (ref 0.50–1.35)
GFR calc Af Amer: 80 mL/min — ABNORMAL LOW (ref >90)
GFR calc non Af Amer: 69 mL/min — ABNORMAL LOW (ref >90)
Potassium: 4.4 mEq/L (ref 3.5–5.1)
Sodium: 138 mEq/L (ref 135–145)

## 2012-10-15 LAB — CBC
HCT: 49.3 % (ref 39.0–52.0)
MCHC: 34.3 g/dL (ref 30.0–36.0)
RDW: 16 % — ABNORMAL HIGH (ref 11.5–15.5)

## 2012-10-15 LAB — LIPID PANEL
Cholesterol: 195 mg/dL (ref 0–200)
LDL Cholesterol: 138 mg/dL — ABNORMAL HIGH (ref 0–99)
Triglycerides: 93 mg/dL (ref <150)

## 2012-10-15 LAB — GLUCOSE, CAPILLARY
Glucose-Capillary: 121 mg/dL — ABNORMAL HIGH (ref 70–99)
Glucose-Capillary: 135 mg/dL — ABNORMAL HIGH (ref 70–99)
Glucose-Capillary: 139 mg/dL — ABNORMAL HIGH (ref 70–99)
Glucose-Capillary: 131 mg/dL — ABNORMAL HIGH (ref 70–99)

## 2012-10-15 LAB — HEMOGLOBIN A1C
Hgb A1c MFr Bld: 6.7 % — ABNORMAL HIGH (ref <5.7)
Mean Plasma Glucose: 146 mg/dL — ABNORMAL HIGH (ref <117)

## 2012-10-15 MED ORDER — HYDRALAZINE HCL 20 MG/ML IJ SOLN: 5.0000 mg | INTRAMUSCULAR | Status: DC | PRN

## 2012-10-15 MED ORDER — INFLUENZA VAC SPLIT QUAD 0.5 ML IM SUSP
0.5000 mL | INTRAMUSCULAR | Status: AC
  Filled 2012-10-15: qty 0.5

## 2012-10-15 MED ORDER — ATORVASTATIN CALCIUM 40 MG PO TABS
40.0000 mg | ORAL_TABLET | Freq: Every day | ORAL | Status: DC
  Administered 2012-10-15 – 2012-10-16 (×2): 40 mg via ORAL
  Filled 2012-10-15 (×3): qty 1

## 2012-10-15 NOTE — Evaluation (Addendum)
Occupational Therapy Evaluation Patient Details Name: Matthew Thornton MRN: [id number] DOB: 11-05-1950 Today's Date: 10/15/2012 Time: 4098-1191 OT Time Calculation (min): 28 min  OT Assessment / Plan / Recommendation History of present illness Mr Hires is a 62 year old gentle man with multiple risk factors (cocaine, alcohol, smoking, hypertension) who comes in with new onset left sided facial droop, slurred speech, problems in coordination and gait, and weakness of limbs which seems to be getting better. He was diagnosed of having an acute right MCA infarct on MRI with no occlusion seen on MRA, but atherosclerotic disease seen, and very advanced chronic small vessel ischemia.   Clinical Impression   Pt presents with below problem list. Pt independent with ADLs, PTA (pt does not drive).  Pt running into things on left side during evaluation and also moving towards the left of his walker during ambulation. Pt will benefit from acute OT to increase independence prior to d/c. Recommending HHOT upon d/c.    OT Assessment  Patient needs continued OT Services    Follow Up Recommendations  Home health OT;Supervision/Assistance - 24 hour    Barriers to Discharge      Equipment Recommendations  3 in 1 bedside comode;Other (comment) (tub equipment tbd)    Recommendations for Other Services    Frequency  Min 2X/week    Precautions / Restrictions Precautions Precautions: Fall Restrictions Weight Bearing Restrictions: No   Pertinent Vitals/Pain No pain reported.     ADL  Grooming: Performed;Wash/dry hands;Wash/dry face;Min guard Where Assessed - Grooming: Unsupported standing Upper Body Bathing: Set up;Supervision/safety Where Assessed - Upper Body Bathing: Supported sitting Lower Body Bathing: Minimal assistance Where Assessed - Lower Body Bathing: Supported sit to stand Upper Body Dressing: Set up Where Assessed - Upper Body Dressing: Supported sitting Lower Body Dressing: Minimal  assistance Where Assessed - Lower Body Dressing: Supported sit to Pharmacist, hospital: Minimal assistance;Min Pension scheme manager Method: Sit to stand (Min A for balance once standing from bed; Min guard to sit) Acupuncturist: Other (comment) (from bed/recliner chair) Tub/Shower Transfer Method: Not assessed Equipment Used: Gait belt;Rolling walker Transfers/Ambulation Related to ADLs: A to move walker closer to sink; pt running into objects on left side. Cues to keep walker on floor and not pick it up. Min guard/Min A for transfers. ADL Comments: Educated on dressing technique and also educated to use left hand. Pt running into objects on left side. Vision also tested.  Educated to stand in front of chair/bed with walker in front when pulling up pants/underwear with someone with him. Discussed smoking and consuming sodium as increased risk for stroke.    OT Diagnosis: Generalized weakness;Cognitive deficits;Disturbance of vision  OT Problem List: Decreased strength;Impaired balance (sitting and/or standing);Impaired vision/perception;Decreased safety awareness;Decreased knowledge of use of DME or AE;Decreased knowledge of precautions;Impaired sensation OT Treatment Interventions: Self-care/ADL training;Neuromuscular education;DME and/or AE instruction;Therapeutic activities;Cognitive remediation/compensation;Visual/perceptual remediation/compensation;Patient/family education;Balance training;Therapeutic exercise   OT Goals(Current goals can be found in the care plan section) Acute Rehab OT Goals Patient Stated Goal: to get out of here OT Goal Formulation: With patient Time For Goal Achievement: 10/22/12 Potential to Achieve Goals: Good ADL Goals Pt Will Perform Grooming: with modified independence;standing Pt Will Perform Lower Body Bathing: with modified independence;sit to/from stand Pt Will Perform Lower Body Dressing: with modified independence;sit to/from stand Pt Will  Transfer to Toilet: with modified independence;ambulating;grab bars (comfort height toilet) Pt Will Perform Toileting - Clothing Manipulation and hygiene: with modified independence;sit to/from stand Pt Will Perform  Tub/Shower Transfer: Tub transfer;with supervision;ambulating;rolling walker (tub equipment tbd)  Visit Information  Last OT Received On: 10/15/12 Assistance Needed: +1 History of Present Illness: Mr Sarate is a 62 year old gentle man with multiple risk factors (cocaine, alcohol, smoking, hypertension) who comes in with new onset left sided facial droop, slurred speech, problems in coordination and gait, and weakness of limbs which seems to be getting better. He was diagnosed of having an acute right MCA infarct on MRI with no occlusion seen on MRA, but atherosclerotic disease seen, and very advanced chronic small vessel ischemia.       Prior Functioning     Home Living Family/patient expects to be discharged to:: Private residence Living Arrangements: Spouse/significant other Available Help at Discharge: Family (unsure how much assist wife can give) Type of Home: House Home Layout: One level  Lives With: Spouse Prior Function Level of Independence: Independent Dominant Hand: Right         Vision/Perception Vision - History Baseline Vision: Wears glasses all the time (wife reports he just received new glasses and he is suppose to wear them all the time) Vision - Assessment Vision Assessment: Vision tested Tracking/Visual Pursuits: Other (comment) (difficulty to left side) Visual Fields: Impaired - to be further tested in functional context   Cognition  Cognition Arousal/Alertness: Awake/alert Behavior During Therapy: WFL for tasks assessed/performed Overall Cognitive Status: Impaired/Different from baseline Area of Impairment: Following commands;Safety/judgement;Problem solving Following Commands: Follows one step commands inconsistently Safety/Judgement:  Decreased awareness of safety Problem Solving: Slow processing;Difficulty sequencing;Requires verbal cues;Requires tactile cues General Comments: Pt left water on after grooming at sink-cues to turn it off.     Extremity/Trunk Assessment Upper Extremity Assessment Upper Extremity Assessment: LUE deficits/detail LUE Deficits / Details: Approximately 150 degrees of AROM shoulder flexion; weak grasp LUE Sensation: decreased light touch     Mobility Bed Mobility Bed Mobility: Supine to Sit;Sitting - Scoot to Edge of Bed Supine to Sit: 4: Min guard;HOB elevated Sitting - Scoot to [place] of Bed: 4: Min guard Transfers Transfers: Sit to Stand;Stand to Sit Sit to Stand: 4: Min assist;With upper extremity assist;From bed;4: Min guard;From chair/3-in-1 Stand to Sit: 4: Min guard;To chair/3-in-1 Details for Transfer Assistance: A for balance once standing.     Exercise     Balance     End of Session OT - End of Session Equipment Utilized During Treatment: Gait belt;Rolling walker Activity Tolerance: Patient tolerated treatment well Patient left: in chair;with call bell/phone within reach;with family/visitor present Nurse Communication: Other (comment) (up in chair)  GO     Earlie Raveling OTR/L [phone] 10/15/2012, 6:03 PM

## 2012-10-15 NOTE — Procedures (Signed)
Objective Swallowing Evaluation: Modified Barium Swallowing Study  Patient Details  Name: CLARION MOONEYHAN MRN: [id number] Date of Birth: 27-Jun-1950  Today's Date: 10/15/2012 Time: 1530-1550 SLP Time Calculation (min): 20 min  Past Medical History:  Past Medical History  Diagnosis Date  . Hypertension   . Pre-diabetes   . Cardiomyopathy   . Seizures   . Multiple lacunar infarcts   . Hepatitis C     S/p interferon therapy  . Tobacco abuse   . Cocaine abuse    Past Surgical History:  Past Surgical History  Procedure Laterality Date  . Back surgery     HPI:  In brief Mr Krist is a 62 year old gentle man with multiple risk factors (cocaine, alcohol, smoking, hypertension) who comes in with new onset left sided facial droop, slurred speech, problems in coordination and gait, and weakness of limbs which seems to be getting better. He was diagnosed of having an acute right MCA infarct on MRI with no occlusion seen on MRA, but atherosclerotic disease seen, and very advanced chronic small vessel ischemia. On exam today, the patient feels better, is able to sit up in bed on his own, still has the left facial droop, but is okay with forehead wrinkling. He does not have any sensation deficits. He has 5/5 strength on the right side and 4/5 on the left side extremities. He still has trouble with coordination and has an abnormal finger nose test on the left. He denies chest pain, palpitations, vision problems or falls. He is very hungry, however has been kept NPO due to reported trouble swallowing. Neurology has seen the patient and recommended full stroke work up and we will proceed to do the same. Patient on aspirin and statin to be started.  MBS ordered following result of BSE to assess risk for aspiration and recommend safest, PO diet.       Assessment / Plan / Recommendation Clinical Impression  Dysphagia Diagnosis: Moderate oral phase dysphagia;Moderate pharyngeal phase dysphagia;Moderate  cervical esophageal phase dysphagia Moderate sensory motor oral phase dysphagia characterized by delayed oral transit with piecemeal swallows with all consistencies.  Moderate pharyngeal dysphagia marked by reduced TBR, incomplete epiglottic deflection, decreased pharyngeal peristalsis, with reduced hyoid laryngeal elevation.  Aspiration with thin liquid by cup during and after swallow from residuals from pyriforms and posterior pharyngeal wall.  Resulting sensed but ineffective cough to remove aspirated material.  Severe residuals from valleculae to pyriforms s/p swallow of nectar thick barium by cup and puree consistency. Pooling in pyriforms prior and after swallow of all consistencies.   Esophageal screen indicates prominent CP segment with reduced CP relaxation.  Appears to be osteophytes at C-5 to C-6.  No radiologist to confirm.  Compensatory strategies of  multiple effortful swallows per bite and sip effective in clearing residuals. No penetration or aspiration noted with thin barium administered by straw with chin down posture. No penetration or aspiration noted with nectar but due to amount of residue s/p swallow judge increased risk for aspiration.   Recommend to proceed with dysphagia 2 ( finely chopped) and thin liquids.  Recommend full supervision with all meals to cue and assist patient as needed to utilize strategies due to noted decreased safety awareness.  Diagnostic treatment completed following evaluation focusing on rec's for diet and swallow strategies.  ST to follow in acute care setting for diet tolerance.      Treatment Recommendation       Diet Recommendation Dysphagia 2 (Fine chop);Thin liquid;No mixed  consistencies   Liquid Administration via: Straw Medication Administration: Whole meds with puree Supervision: Patient able to self feed;Full supervision/cueing for compensatory strategies Compensations: Small sips/bites;Slow rate;Multiple dry swallows after each bite/sip;Hard  cough after swallow;Effortful swallow Postural Changes and/or Swallow Maneuvers: Out of bed for meals;Seated upright 90 degrees;Upright 30-60 min after meal    Other  Recommendations Oral Care Recommendations: Oral care QID   Follow Up Recommendations  Home health SLP;24 hour supervision/assistance    Frequency and Duration min 2x/week  2 weeks       SLP Swallow Goals Patient will utilize recommended strategies during swallow to increase swallowing safety with: Maximal cueing   General Date of Onset: 10/14/12 HPI: In brief Mr Fina is a 62 year old gentle man with multiple risk factors (cocaine, alcohol, smoking, hypertension) who comes in with new onset left sided facial droop, slurred speech, problems in coordination and gait, and weakness of limbs which seems to be getting better. He was diagnosed of having an acute right MCA infarct on MRI with no occlusion seen on MRA, but atherosclerotic disease seen, and very advanced chronic small vessel ischemia. On exam today, the patient feels better, is able to sit up in bed on his own, still has the left facial droop, but is okay with forehead wrinkling. He does not have any sensation deficits. He has 5/5 strength on the right side and 4/5 on the left side extremities. He still has trouble with coordination and has an abnormal finger nose test on the left. He denies chest pain, palpitations, vision problems or falls. He is very hungry, however has been kept NPO due to reported trouble swallowing. Neurology has seen the patient and recommended full stroke work up and we will proceed to do the same. Patient on aspirin and statin to be started.   Type of Study: Modified Barium Swallowing Study Reason for Referral: Objectively evaluate swallowing function Previous Swallow Assessment: BSE 9/28 NPO  Diet Prior to this Study: NPO Temperature Spikes Noted: No Respiratory Status: Room air History of Recent Intubation: No Behavior/Cognition:  Alert;Cooperative;Pleasant mood;Confused;Decreased sustained attention;Distractible;Requires cueing Oral Cavity - Dentition: Adequate natural dentition Oral Motor / Sensory Function: Impaired - see Bedside swallow eval Self-Feeding Abilities: Able to feed self;Needs assist Patient Positioning: Upright in bed Baseline Vocal Quality: Wet Volitional Cough: Strong Volitional Swallow: Able to elicit Anatomy: Within functional limits Pharyngeal Secretions: Not observed secondary MBS    Reason for Referral Objectively evaluate swallowing function   Oral Phase Oral Preparation/Oral Phase Oral Phase: Impaired Oral - Nectar Oral - Nectar Teaspoon: Reduced posterior propulsion;Incomplete tongue to palate contact;Piecemeal swallowing;Delayed oral transit;Weak lingual manipulation Oral - Nectar Cup: Incomplete tongue to palate contact;Reduced posterior propulsion;Piecemeal swallowing;Delayed oral transit Oral - Thin Oral - Thin Teaspoon: Incomplete tongue to palate contact;Reduced posterior propulsion;Weak lingual manipulation;Piecemeal swallowing;Delayed oral transit Oral - Thin Cup: Incomplete tongue to palate contact;Piecemeal swallowing;Reduced posterior propulsion;Weak lingual manipulation;Delayed oral transit Oral - Thin Straw: Incomplete tongue to palate contact;Reduced posterior propulsion;Piecemeal swallowing;Delayed oral transit;Weak lingual manipulation Oral - Solids Oral - Puree: Incomplete tongue to palate contact;Piecemeal swallowing;Reduced posterior propulsion;Delayed oral transit;Lingual pumping;Lingual/palatal residue Oral - Mechanical Soft: Incomplete tongue to palate contact;Piecemeal swallowing;Reduced posterior propulsion;Lingual/palatal residue Oral - Pill: Incomplete tongue to palate contact;Weak lingual manipulation;Delayed oral transit Oral Phase - Comment Oral Phase - Comment: Unable to propel pill posterior   Pharyngeal Phase Pharyngeal Phase Pharyngeal Phase:  Impaired Pharyngeal - Nectar Pharyngeal - Nectar Teaspoon: Reduced pharyngeal peristalsis;Reduced epiglottic inversion;Reduced anterior laryngeal mobility;Reduced laryngeal elevation;Reduced airway/laryngeal  closure;Pharyngeal residue - cp segment;Pharyngeal residue - posterior pharnyx;Pharyngeal residue - pyriform sinuses;Pharyngeal residue - valleculae Pharyngeal - Nectar Cup: Reduced pharyngeal peristalsis;Reduced tongue base retraction;Reduced epiglottic inversion;Reduced anterior laryngeal mobility;Reduced laryngeal elevation;Reduced airway/laryngeal closure;Pharyngeal residue - cp segment;Pharyngeal residue - pyriform sinuses;Pharyngeal residue - valleculae;Pharyngeal residue - posterior pharnyx Pharyngeal - Thin Pharyngeal - Thin Teaspoon: Reduced pharyngeal peristalsis;Reduced tongue base retraction;Reduced epiglottic inversion;Reduced anterior laryngeal mobility;Reduced laryngeal elevation;Reduced airway/laryngeal closure;Pharyngeal residue - posterior pharnyx;Pharyngeal residue - pyriform sinuses;Pharyngeal residue - cp segment Pharyngeal - Thin Cup: Reduced pharyngeal peristalsis;Reduced epiglottic inversion;Reduced anterior laryngeal mobility;Reduced laryngeal elevation;Reduced airway/laryngeal closure;Reduced tongue base retraction;Penetration/Aspiration after swallow;Penetration/Aspiration during swallow;Trace aspiration;Pharyngeal residue - pyriform sinuses;Pharyngeal residue - posterior pharnyx;Pharyngeal residue - cp segment Penetration/Aspiration details (thin cup): Material enters airway, passes BELOW cords and not ejected out despite cough attempt by patient Pharyngeal - Thin Straw: Reduced pharyngeal peristalsis;Reduced epiglottic inversion;Reduced anterior laryngeal mobility;Reduced laryngeal elevation;Reduced airway/laryngeal closure;Reduced tongue base retraction;Pharyngeal residue - cp segment;Pharyngeal residue - pyriform sinuses;Pharyngeal residue - posterior pharnyx Pharyngeal -  Solids Pharyngeal - Puree: Reduced pharyngeal peristalsis;Reduced tongue base retraction;Reduced epiglottic inversion;Reduced anterior laryngeal mobility;Reduced laryngeal elevation;Reduced airway/laryngeal closure;Pharyngeal residue - cp segment;Pharyngeal residue - valleculae;Pharyngeal residue - pyriform sinuses;Pharyngeal residue - posterior pharnyx Pharyngeal - Mechanical Soft: Reduced pharyngeal peristalsis;Reduced anterior laryngeal mobility;Reduced laryngeal elevation;Reduced airway/laryngeal closure;Reduced epiglottic inversion;Reduced tongue base retraction;Pharyngeal residue - cp segment;Pharyngeal residue - pyriform sinuses;Pharyngeal residue - posterior pharnyx  Cervical Esophageal Phase    GO    Cervical Esophageal Phase Cervical Esophageal Phase: Impaired Cervical Esophageal Phase - Thin Thin Straw: Prominent cricopharyngeal segment;Reduced cricopharyngeal relaxation Cervical Esophageal Phase - Solids Mechanical Soft: Reduced cricopharyngeal relaxation;Prominent cricopharyngeal segment        Moreen Fowler MS, CCC-SLP [phone] [facility] 10/15/2012, 5:46 PM

## 2012-10-15 NOTE — H&P (Signed)
I saw and evaluated the patient.  I personally confirmed the key portions of the history and exam documented by Dr. Mariea Clonts and I reviewed pertinent patient test results.  The assessment, diagnosis, and plan were formulated together and I agree with the documentation in the resident's note. Please see my brief summary of the case and additions, if any, below. For details, refer to the resident note.   In brief Matthew Thornton is a 62 year old gentle man with multiple risk factors (cocaine, alcohol, smoking, hypertension) who comes in with new onset left sided facial droop, slurred speech, problems in coordination and gait, and weakness of limbs which seems to be getting better. He was diagnosed of having an acute right MCA infarct on MRI with no occlusion seen on MRA, but atherosclerotic disease seen, and very advanced chronic small vessel ischemia. On exam today, the patient feels better, is able to sit up in bed on his own, still has the left facial droop, but is okay with forehead wrinkling. He does not have any sensation deficits. He has 5/5 strength on the right side and 4/5 on the left side extremities. He still has trouble with coordination and has an abnormal finger nose test on the left. He denies chest pain, palpitations, vision problems or falls. He is very hungry, however has been kept NPO due to reported trouble swallowing. Neurology has seen the patient and recommended full stroke work up and we will proceed to do the same. Patient on aspirin and statin to be started.
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
## 2012-10-15 NOTE — Evaluation (Signed)
Physical Therapy Evaluation Patient Details Name: YARNELL ARVIDSON MRN: [id number] DOB: April 03, 1950 Today's Date: 10/15/2012 Time: 4098-1191 PT Time Calculation (min): 39 min  PT Assessment / Plan / Recommendation History of Present Illness  Mr Sox is a 62 year old gentle man with multiple risk factors (cocaine, alcohol, smoking, hypertension) who comes in with new onset left sided facial droop, slurred speech, problems in coordination and gait, and weakness of limbs which seems to be getting better. He was diagnosed of having an acute right MCA infarct on MRI with no occlusion seen on MRA, but atherosclerotic disease seen, and very advanced chronic small vessel ischemia.  Clinical Impression  Pt admitted with stroke. Pt currently with functional limitations due to the deficits listed below (see PT Problem List).  Pt will benefit from skilled PT to increase their independence and safety with mobility to allow discharge to the venue listed below.       PT Assessment  Patient needs continued PT services    Follow Up Recommendations  Home health PT;Supervision - Intermittent    Does the patient have the potential to tolerate intense rehabilitation      Barriers to Discharge Decreased caregiver support Wife can only provide supervision; can't provide physical assist    Equipment Recommendations  Rolling walker with 5" wheels;3in1 (PT)    Recommendations for Other Services     Frequency Min 4X/week    Precautions / Restrictions Precautions Precautions: Fall Restrictions Weight Bearing Restrictions: No   Pertinent Vitals/Pain Some reported knee pain, throbbing, did not rate, other than that, no apparent distress       Mobility  Bed Mobility Bed Mobility: Supine to Sit;Sitting - Scoot to Edge of Bed Supine to Sit: 4: Min guard;HOB elevated Sitting - Scoot to [place] of Bed: 4: Min guard Details for Bed Mobility Assistance: overall smooth motion Transfers Transfers: Sit to  Stand;Stand to Sit Sit to Stand: 4: Min assist;With upper extremity assist;From bed;4: Min guard;From chair/3-in-1 Stand to Sit: 4: Min guard;To chair/3-in-1 Details for Transfer Assistance: A for balance once standing. cues for safety/hand placement Ambulation/Gait Ambulation/Gait Assistance: 4: Min guard Ambulation Distance (Feet): 200 Feet Assistive device: Rolling walker Ambulation/Gait Assistance Details: cues to scan L during walk, and therapist engaged pt from Left during walk; Noted one instance of pt's L hand sliding off of RW Modified Rankin (Stroke Patients Only) Pre-Morbid Rankin Score: No symptoms Modified Rankin: Moderately severe disability    Exercises     PT Diagnosis: Difficulty walking;Abnormality of gait  PT Problem List: Decreased balance;Decreased mobility;Decreased coordination;Decreased cognition;Decreased knowledge of use of DME;Decreased safety awareness;Decreased knowledge of precautions PT Treatment Interventions: DME instruction;Gait training;Stair training;Functional mobility training;Therapeutic activities;Therapeutic exercise;Balance training;Cognitive remediation;Patient/family education     PT Goals(Current goals can be found in the care plan section) Acute Rehab PT Goals Patient Stated Goal: to get out of here PT Goal Formulation: With patient Time For Goal Achievement: 10/29/12 Potential to Achieve Goals: Good  Visit Information  Last PT Received On: 10/15/12 Assistance Needed: +1 History of Present Illness: Mr Kester is a 62 year old gentle man with multiple risk factors (cocaine, alcohol, smoking, hypertension) who comes in with new onset left sided facial droop, slurred speech, problems in coordination and gait, and weakness of limbs which seems to be getting better. He was diagnosed of having an acute right MCA infarct on MRI with no occlusion seen on MRA, but atherosclerotic disease seen, and very advanced chronic small vessel ischemia.  Prior Functioning  Home Living Family/patient expects to be discharged to:: Private residence Living Arrangements: Spouse/significant other Available Help at Discharge: Family (unsure how much assist wife can give) Type of Home: House Home Access: Stairs to enter Entrance Stairs-Number of Steps: 1 Entrance Stairs-Rails: None Home Layout: One level Home Equipment: None  Lives With: Spouse Prior Function Level of Independence: Independent Dominant Hand: Right    Cognition  Cognition Arousal/Alertness: Awake/alert Behavior During Therapy: WFL for tasks assessed/performed Overall Cognitive Status: Impaired/Different from baseline Area of Impairment: Following commands;Safety/judgement;Problem solving Following Commands: Follows one step commands inconsistently Safety/Judgement: Decreased awareness of safety Problem Solving: Slow processing;Difficulty sequencing;Requires verbal cues;Requires tactile cues General Comments: Left inattention; needs cues to scan L with gait    Extremity/Trunk Assessment Upper Extremity Assessment Upper Extremity Assessment: Defer to OT evaluation LUE Deficits / Details: Approximately 150 degrees of AROM shoulder flexion; weak grasp LUE Sensation: decreased light touch Lower Extremity Assessment Lower Extremity Assessment: Overall WFL for tasks assessed   Balance    End of Session PT - End of Session Equipment Utilized During Treatment: Gait belt Activity Tolerance: Patient tolerated treatment well Patient left: in chair;with call bell/phone within reach;with family/visitor present Nurse Communication: Mobility status  GP     Olen Pel [place], [place] [phone]  10/15/2012, 6:20 PM

## 2012-10-15 NOTE — Progress Notes (Signed)
Pt.'s BP is 151/120.  MD notified.  Orders given to call if BP is equal or higher than 220/120.  Will continue to monitor patient.

## 2012-10-15 NOTE — Evaluation (Signed)
Speech Language Pathology Evaluation Patient Details Name: Matthew Thornton MRN: [id number] DOB: 29-Jan-1950 Today's Date: 10/15/2012 Time: 4098-1191 SLP Time Calculation (min): 15 min  Problem List:  Patient Active Problem List   Diagnosis Date Noted  . Other and unspecified hyperlipidemia 10/15/2012  . Hypertension 10/14/2012  . Multiple lacunar infarcts 10/14/2012  . Hepatitis C 10/14/2012  . Seizure disorder 10/14/2012  . Tobacco abuse 10/14/2012  . Cocaine use 10/14/2012  . Acute right MCA stroke 10/14/2012   Past Medical History:  Past Medical History  Diagnosis Date  . Hypertension   . Pre-diabetes   . Cardiomyopathy   . Seizures   . Multiple lacunar infarcts   . Hepatitis C     S/p interferon therapy  . Tobacco abuse   . Cocaine abuse    Past Surgical History:  Past Surgical History  Procedure Laterality Date  . Back surgery     HPI:  In brief Matthew Thornton is a 62 year old male with multiple risk factors (cocaine, alcohol, smoking, hypertension) who comes in with new onset left sided facial droop, slurred speech, problems in coordination and gait, and weakness of limbs which seems to be getting better. He was diagnosed of having an acute right MCA infarct on MRI with no occlusion seen on MRA, but atherosclerotic disease seen, and very advanced chronic small vessel ischemia. On exam today, the patient feels better, is able to sit up in bed on his own, still has the left facial droop, but is okay with forehead wrinkling. He does not have any sensation deficits. He has 5/5 strength on the right side and 4/5 on the left side extremities. He still has trouble with coordination and has an abnormal finger nose test on the left. He denies chest pain, palpitations, vision problems or falls. He is very hungry, however has been kept NPO due to reported trouble swallowing. Neurology has seen the patient and recommended full stroke work up and we will proceed to do the same. Patient on  aspirin and statin to be started.  Cognitive Linguistic Evaluation  Ordered per stroke protocol.   Assessment / Plan / Recommendation Clinical Impression  Cognitive Linguistic Evaluation completed.  Receptive and expressive language skills judged to be baseline with no oberved aphasia.  Intelligibility of connected speech affected at conversational level due to min to moderate dysarthria.  Moderate cognitive deficits evident in areas of problem solving, safety awareness, and sustained attention during functional basic and verbal complex tasks.  Recommend continued Skilled ST Treatment in acute care setting to address deficits to increase safety.      SLP Assessment  Patient needs continued Speech Lanaguage Pathology Services    Follow Up Recommendations  Home health SLP    Frequency and Duration min 2x/week  2 weeks      SLP Goals  SLP Goals Potential to Achieve Goals: Fair Potential Considerations: Co-morbidities;Cooperation/participation level;Previous level of function Progress/Goals/Alternative treatment plan discussed with pt/caregiver and they: Agree SLP Goal #1: Complete target functional problem solving tasks with moderate verbal and tactile cues to attend to the left SLP Goal #2: Demonstrate emergent and anticipatory awareness by correctly stating possible problems from current physical deficits when completing ADL's  SLP Evaluation Prior Functioning  Cognitive/Linguistic Baseline: Information not available Type of Home: House  Lives With: Spouse Available Help at Discharge: Family;Available PRN/intermittently Education: Retired from [employer]  Vocation: Retired   IT consultant  Overall Cognitive Status: Impaired/Different from baseline Arousal/Alertness: Awake/alert Orientation Level: Oriented X4 Attention: Focused Focused  Attention: Impaired Focused Attention Impairment: Verbal complex;Functional basic Memory: Impaired Memory Impairment: Decreased recall of new  information;Storage deficit Awareness: Impaired Awareness Impairment: Anticipatory impairment;Emergent impairment Problem Solving: Impaired Problem Solving Impairment: Verbal complex;Functional basic Executive Function: Sequencing;Organizing;Initiating Sequencing: Impaired Sequencing Impairment: Verbal complex;Functional basic Organizing: Impaired Organizing Impairment: Verbal complex;Functional basic Initiating: Impaired Initiating Impairment: Verbal complex;Functional basic Behaviors: Restless;Impulsive;Poor frustration tolerance    Comprehension  Auditory Comprehension Overall Auditory Comprehension: Appears within functional limits for tasks assessed Visual Recognition/Discrimination Discrimination: Not tested Reading Comprehension Reading Status: Not tested    Expression Expression Primary Mode of Expression: Verbal Verbal Expression Overall Verbal Expression: Appears within functional limits for tasks assessed Written Expression Dominant Hand: Right   Oral / Motor Oral Motor/Sensory Function Overall Oral Motor/Sensory Function: Impaired Labial ROM: Reduced left Labial Symmetry: Abnormal symmetry left Labial Strength: Reduced Labial Sensation: Reduced Lingual ROM: Reduced left Lingual Symmetry: Abnormal symmetry left Lingual Strength: Reduced Lingual Sensation: Reduced Facial ROM: Reduced left Facial Symmetry: Left droop Facial Strength: Reduced Facial Sensation: Reduced Velum: Within Functional Limits Mandible: Within Functional Limits Motor Speech Overall Motor Speech: Impaired Intelligibility: Intelligibility reduced Word: 75-100% accurate Phrase: 75-100% accurate Sentence: 50-74% accurate Conversation: 50-74% accurate Effective Techniques: Slow rate   GO    Matthew Fowler MS, CCC-SLP [phone] [facility] 10/15/2012, 5:28 PM

## 2012-10-15 NOTE — Progress Notes (Signed)
VASCULAR LAB PRELIMINARY  PRELIMINARY  PRELIMINARY  PRELIMINARY  Carotid Dopplers completed.    Preliminary report:  There is 1-39% ICA stenosis.  Vertebral artery flow is antegrade.  Calissa Swenor, RVT 10/15/2012, 10:33 AM

## 2012-10-15 NOTE — Progress Notes (Signed)
  Echocardiogram 2D Echocardiogram has been performed.  Matthew Thornton 10/15/2012, 11:10 AM

## 2012-10-15 NOTE — Progress Notes (Signed)
Stroke Team Progress Note  HISTORY Matthew Thornton is a 62 y.o. male that reports that he was well yesterday 10/13/2012. That evening while watching television he began to eat chips and was unable to swallow. When he started to drink he was unable to handle the fluid in his mouth. By the following morning, 10/14/2012, his symptoms had not improved and the patient presented for evaluation.   Date last known well: Date: 10/13/2012  Time last known well: Time: 21:00  tPA Given: No: Outside time window   SUBJECTIVE The patient's wife is present this morning. The patient feels fine other than being hungry.  OBJECTIVE Most recent Vital Signs: Filed Vitals:   10/15/12 0150 10/15/12 0400 10/15/12 0600 10/15/12 0911  BP: 159/92 180/105 173/102 151/120  Pulse: 70 66 67 69  Temp: 97.8 F (36.6 C) 98 F (36.7 C) 98 F (36.7 C) 98.3 F (36.8 C)  TempSrc: Oral Oral Oral Oral  Resp: 20 20 20 18   Height:      Weight:      SpO2: 99% 96% 97% 100%   CBG (last 3)   Recent Labs  10/14/12 1944 10/15/12 0014 10/15/12 0411  GLUCAP 135* 139* 121*    IV Fluid Intake:   . sodium chloride 100 mL/hr at 10/15/12 0018    MEDICATIONS  . aspirin  300 mg Rectal Daily   Or  . aspirin  325 mg Oral Daily  . atorvastatin  40 mg Oral q1800  . heparin  5,000 Units Subcutaneous Q8H  . influenza vac split quadrivalent PF  0.5 mL Intramuscular Tomorrow-1000  . [START ON 10/16/2012] influenza vac split quadrivalent PF  0.5 mL Intramuscular Tomorrow-1000  . insulin aspart  0-15 Units Subcutaneous Q4H   PRN:  chlorpheniramine-HYDROcodone  Diet:  NPO  Activity:  Bedrest  DVT Prophylaxis:  Subcutaneous heparin  CLINICALLY SIGNIFICANT STUDIES Basic Metabolic Panel:   Recent Labs Lab 10/14/12 0831 10/14/12 0851 10/15/12 0501  NA 136 141 138  K 4.4 4.3 4.4  CL 102 106 103  CO2 24  --  23  GLUCOSE 138* 143* 102*  BUN 9 9 11   CREATININE 1.10 1.10 1.10  CALCIUM 9.2  --  9.3   Liver Function Tests:    Recent Labs Lab 10/14/12 0831  AST 31  ALT 28  ALKPHOS 101  BILITOT 0.7  PROT 7.7  ALBUMIN 3.7   CBC:   Recent Labs Lab 10/14/12 0831 10/14/12 0851 10/15/12 0501  WBC 5.6  --  7.0  NEUTROABS 2.4  --   --   HGB 16.7 18.4* 16.9  HCT 49.1 54.0* 49.3  MCV 74.1*  --  74.0*  PLT 132*  --  140*   Coagulation:   Recent Labs Lab 10/14/12 0831  LABPROT 13.3  INR 1.03   Cardiac Enzymes:   Recent Labs Lab 10/14/12 0831 10/14/12 1949  TROPONINI <0.30 <0.30   Urinalysis:   Recent Labs Lab 10/14/12 0908  COLORURINE YELLOW  LABSPEC 1.004*  PHURINE 6.0  GLUCOSEU NEGATIVE  HGBUR NEGATIVE  BILIRUBINUR NEGATIVE  KETONESUR NEGATIVE  PROTEINUR NEGATIVE  UROBILINOGEN 1.0  NITRITE NEGATIVE  LEUKOCYTESUR SMALL*   Lipid Panel    Component Value Date/Time   CHOL 195 10/15/2012 0501   TRIG 93 10/15/2012 0501   HDL 38* 10/15/2012 0501   CHOLHDL 5.1 10/15/2012 0501   VLDL 19 10/15/2012 0501   LDLCALC 138* 10/15/2012 0501   HgbA1C  No results found for this basename: HGBA1C  Urine Drug Screen:     Component Value Date/Time   LABOPIA NONE DETECTED 10/14/2012 0908   LABOPIA NEGATIVE 11/15/2007 1346   COCAINSCRNUR POSITIVE* 10/14/2012 0908   COCAINSCRNUR NEGATIVE 11/15/2007 1346   LABBENZ NONE DETECTED 10/14/2012 0908   LABBENZ NEGATIVE 11/15/2007 1346   AMPHETMU NONE DETECTED 10/14/2012 0908   AMPHETMU NEGATIVE 11/15/2007 1346   THCU POSITIVE* 10/14/2012 0908   LABBARB NONE DETECTED 10/14/2012 0908    Alcohol Level:   Recent Labs Lab 10/14/12 0831  ETH <11    Dg Chest 2 View 10/15/2012    Cardiomegaly, without congestive failure or acute disease.    Ct Head Wo Contrast 10/14/2012    No evidence of acute intracranial abnormality.  Extensive small vessel ischemic changes with bilateral basal ganglia lacunar infarcts.     MRI Brain Wo Contrast 10/14/2012    1. Acute right MCA white matter infarct. No mass effect or hemorrhage.  2. Underlying very advanced  chronic small vessel ischemia.     MRA Head 10/14/2012    1. Atherosclerosis versus thromboembolic disease at the distal right MCA M1 segment and bifurcation. Stenosis occurs but no major right MCA branch occlusion is identified.  2.  No other significant intracranial stenosis identified.    2D Echocardiogram  pending  Carotid Doppler   Carotid Dopplers completed.  Preliminary report: There is 1-39% ICA stenosis. Vertebral artery flow is antegrade.   EKG  sinus rhythm rate 52 beats per minute.  Therapy Recommendations pending  Physical Exam    Neurologic Examination:   General - soft spoken 62 year old male in no acute distress Heart - Regular rate and rhythm - distant heart sounds Lungs - Clear to auscultation anteriorly Extremities - Distal pulses intact - no edema Skin - Warm and dry   Mental Status:  Alert, oriented, thought content appropriate. Speech fluent but slurred. Able to follow 3 step commands without difficulty.  Cranial Nerves:  II: Discs flat bilaterally; Visual fields grossly normal, pupils equal, round, reactive to light and accommodation  III,IV, VI: ptosis not present, extra-ocular motions intact bilaterally but patient gaze preference is to the right  V,VII: left facial droop, facial light touch sensation normal bilaterally  VIII: hearing normal bilaterally  IX,X: gag reflex reduced  XI: bilateral shoulder shrug  XII: midline tongue extension  Motor:  Right : Upper extremity 5/5 Left: Upper extremity 4/5  Lower extremity 5/5 Lower extremity 4/5  Tone and bulk:normal tone throughout; no atrophy noted  Sensory: Pinprick and light touch intact throughout, bilaterally  Deep Tendon Reflexes: 2+ in the upper extremities, trace at the knees and absent at the ankles  Plantars:  Right: mute Left: mute  Cerebellar:  Finger to nose with difficulty on the left possibly due to to weakness.  Gait: Deferred CV: pulses palpable throughout    ASSESSMENT Mr.  EVERSON MOTT is a 62 y.o. male presenting with dysphagia and left hemiparesis. TPA was not given as the patient was outside of the time window. An MRI revealed an acute right MCA white matter infarct. Infarct felt to be thrombotic secondary to atherosclerosis versus thromboembolic disease  On no antithrombotics prior to admission. Now on aspirin suppositories 300 mg daily for secondary stroke prevention. Patient with resultant left hemiparesis and dysphagia. Work up underway.   Hypertension - poorly controlled  Diabetes mellitus - hemoglobin A1c pending  Cardiomyopathy  History of seizure disorder  Tobacco use  Urine drug screen positive for cocaine and THC  Hyperlipidemia - LDL  138 cholesterol 195. No statins prior to admission. Now on Lipitor.  Hospital day # 1  The patient uses crack cocaine, and he had used this the day of the stroke.  TREATMENT/PLAN  Continue Aspirin suppository 300 mg daily for secondary stroke prevention until swallowing has been cleared.  Await echo, and hemoglobin A1c.  Await therapist evaluations  Risk factor modification  N.p.o. until cleared by speech therapy.  Delton See PA-C [facility] Pager [phone] 10/15/2012, 9:41 AM  I have personally obtained a history, examined the patient, evaluated imaging results, and formulated the assessment and plan of care. I agree with the above.  Lesly Dukes

## 2012-10-15 NOTE — Evaluation (Signed)
Clinical/Bedside Swallow Evaluation Patient Details  Name: Matthew Thornton MRN: [id number] Date of Birth: 1950-05-03  Today's Date: 10/15/2012 Time: 1200-1230 SLP Time Calculation (min): 30 min  Past Medical History:  Past Medical History  Diagnosis Date  . Hypertension   . Pre-diabetes   . Cardiomyopathy   . Seizures   . Multiple lacunar infarcts   . Hepatitis C     S/p interferon therapy  . Tobacco abuse   . Cocaine abuse    Past Surgical History:  Past Surgical History  Procedure Laterality Date  . Back surgery     HPI:   brief Matthew Thornton is a 62 year old gentle man with multiple risk factors (cocaine, alcohol, smoking, hypertension) who comes in with new onset left sided facial droop, slurred speech, problems in coordination and gait, and weakness of limbs which seems to be getting better. He was diagnosed of having an acute right MCA infarct on MRI with no occlusion seen on MRA, but atherosclerotic disease seen, and very advanced chronic small vessel ischemia. On exam today, the patient feels better, is able to sit up in bed on his own, still has the left facial droop, but is okay with forehead wrinkling. He does not have any sensation deficits. He has 5/5 strength on the right side and 4/5 on the left side extremities. He still has trouble with coordination and has an abnormal finger nose test on the left. He denies chest pain, palpitations, vision problems or falls. He is very hungry, however has been kept NPO due to reported trouble swallowing. Neurology has seen the patient and recommended full stroke work up and we will proceed to do the same.   BSE indicated per stroke protocol.     Assessment / Plan / Recommendation Clinical Impression  Moderate oral and pharyngeal dysphagia with + s/s of suspected penetration vs. aspiration s/p swallow of all consistencies.  Recommend to proceed with objective evaluation to asses risk for aspiration and determine safest, PO diet.  MBS to  be completed this date.     Aspiration Risk  Moderate    Diet Recommendation NPO        Other  Recommendations Recommended Consults: MBS Oral Care Recommendations: Oral care QID   Follow Up Recommendations  Outpatient SLP    Frequency and Duration min 2x/week  2 weeks       SLP Swallow Goals  Pending results of MBS   Swallow Study Prior Functional Status  Lives at home with spouse history of dysphagia     General Date of Onset: 10/14/12 HPI:  brief Matthew Thornton is a 62 year old gentle man with multiple risk factors (cocaine, alcohol, smoking, hypertension) who comes in with new onset left sided facial droop, slurred speech, problems in coordination and gait, and weakness of limbs which seems to be getting better. He was diagnosed of having an acute right MCA infarct on MRI with no occlusion seen on MRA, but atherosclerotic disease seen, and very advanced chronic small vessel ischemia. On exam today, the patient feels better, is able to sit up in bed on his own, still has the left facial droop, but is okay with forehead wrinkling. He does not have any sensation deficits. He has 5/5 strength on the right side and 4/5 on the left side extremities. He still has trouble with coordination and has an abnormal finger nose test on the left. He denies chest pain, palpitations, vision problems or falls. He is very hungry, however has  been kept NPO due to reported trouble swallowing. Neurology has seen the patient and recommended full stroke work up and we will proceed to do the same. Patient on aspirin and statin to be started.   Type of Study: Bedside swallow evaluation Diet Prior to this Study: NPO Temperature Spikes Noted: No Respiratory Status: Room air History of Recent Intubation: No Behavior/Cognition: Alert;Cooperative;Pleasant mood;Confused;Distractible Oral Cavity - Dentition: Adequate natural dentition Self-Feeding Abilities: Able to feed self;Needs assist Patient Positioning:  Upright in bed Baseline Vocal Quality: Wet Volitional Cough: Wet;Congested Volitional Swallow: Able to elicit    Oral/Motor/Sensory Function Overall Oral Motor/Sensory Function: Impaired Labial ROM: Reduced left Labial Symmetry: Abnormal symmetry left Labial Strength: Reduced Labial Sensation: Reduced Lingual ROM: Reduced left Lingual Symmetry: Abnormal symmetry left Lingual Strength: Reduced Lingual Sensation: Reduced Facial ROM: Reduced left Facial Symmetry: Left droop Facial Strength: Reduced Facial Sensation: Reduced Velum: Within Functional Limits Mandible: Within Functional Limits   Ice Chips Ice chips: Impaired Oral Phase Impairments: Reduced labial seal;Reduced lingual movement/coordination;Poor awareness of bolus Pharyngeal Phase Impairments: Suspected delayed Swallow;Decreased hyoid-laryngeal movement;Wet Vocal Quality;Throat Clearing - Immediate;Cough - Immediate   Thin Liquid Thin Liquid: Impaired Presentation: Cup;Spoon;Straw Pharyngeal  Phase Impairments: Suspected delayed Swallow;Decreased hyoid-laryngeal movement;Multiple swallows;Wet Vocal Quality;Throat Clearing - Immediate;Cough - Delayed    Nectar Thick Nectar Thick Liquid: Impaired Presentation: Cup Oral phase functional implications: Left anterior spillage Pharyngeal Phase Impairments: Suspected delayed Swallow;Decreased hyoid-laryngeal movement;Throat Clearing - Delayed   Honey Thick Honey Thick Liquid: Not tested   Puree Puree: Impaired Presentation: Self Fed;Spoon Oral Phase Impairments: Reduced lingual movement/coordination Pharyngeal Phase Impairments: Suspected delayed Swallow;Decreased hyoid-laryngeal movement;Throat Clearing - Delayed;Multiple swallows   Solid   GO    Solid: Impaired Oral Phase Impairments: Reduced labial seal;Reduced lingual movement/coordination Oral Phase Functional Implications: Left anterior spillage;Oral residue;Oral holding Pharyngeal Phase Impairments: Suspected delayed  Swallow;Decreased hyoid-laryngeal movement;Throat Clearing - Delayed      Moreen Fowler MS, CCC-SLP [phone] [facility] 10/15/2012,1:15 PM

## 2012-10-16 LAB — GLUCOSE, CAPILLARY
Glucose-Capillary: 107 mg/dL — ABNORMAL HIGH (ref 70–99)
Glucose-Capillary: 120 mg/dL — ABNORMAL HIGH (ref 70–99)
Glucose-Capillary: 123 mg/dL — ABNORMAL HIGH (ref 70–99)
Glucose-Capillary: 128 mg/dL — ABNORMAL HIGH (ref 70–99)

## 2012-10-16 MED ORDER — GABAPENTIN 600 MG PO TABS
600.0000 mg | ORAL_TABLET | Freq: Four times a day (QID) | ORAL | Status: DC
  Administered 2012-10-16 – 2012-10-17 (×5): 600 mg via ORAL
  Filled 2012-10-16 (×8): qty 1

## 2012-10-16 MED ORDER — LISINOPRIL 40 MG PO TABS
40.0000 mg | ORAL_TABLET | Freq: Every day | ORAL | Status: DC
  Administered 2012-10-16 – 2012-10-17 (×2): 40 mg via ORAL
  Filled 2012-10-16 (×2): qty 1

## 2012-10-16 NOTE — Progress Notes (Signed)
Occupational Therapy Treatment Patient Details Name: Matthew Thornton MRN: [id number] DOB: 1950-08-15 Today's Date: 10/16/2012 Time: 7829-5621 OT Time Calculation (min): 35 min  OT Assessment / Plan / Recommendation  History of present illness Matthew Thornton is a 62 year old gentle man with multiple risk factors (cocaine, alcohol, smoking, hypertension) who comes in with new onset left sided facial droop, slurred speech, problems in coordination and gait, and weakness of limbs which seems to be getting better. He was diagnosed of having an acute right MCA infarct on MRI with no occlusion seen on MRA, but atherosclerotic disease seen, and very advanced chronic small vessel ischemia.   OT comments  Matthew Thornton reported pt's recent behaviors to OT today. Pt with impulsivity, decreased attention and awareness, and safety.  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 OT presented pt with letter cancellation test and demonstrated an unorganized scanning pattern. Pt given other test and presents with apparent spacial relation dysfunction. Gave pt theraputty and handout for exercises and gave Matthew Thornton a handout on fine motor coordination activities as he presents with weakness and decreased coordination in LUE.  Educated Matthew Thornton on compensatory strategies to use with pt at home for inattention to left side. Also, spoke with her about ideas for scanning and attention.    Follow Up Recommendations  Home health OT;Supervision/Assistance - 24 hour    Barriers to Discharge       Equipment Recommendations  3 in 1 bedside comode;Other (comment) (tub equipment tbd)    Recommendations for Other Services    Frequency Min 2X/week   Progress towards OT Goals Progress towards OT goals: Progressing toward goals  Plan Discharge plan remains appropriate    Precautions / Restrictions Precautions Precautions: Fall Precaution Comments: impulsive, quick to move, decreased awareness Restrictions Weight Bearing  Restrictions: No   Pertinent Vitals/Pain No pain reported.     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 OT presented pt with letter cancellation test and demonstrated an unorganized scanning pattern. Pt given other test and presents with apparent spacial relation dysfunction. Gave pt theraputty and handout for exercises and gave Matthew Thornton a handout on fine motor coordination activities as he presents with weakness and decreased coordination in LUE.  Educated Matthew Thornton on compensatory strategies to use with pt at home for inattention to left side. Also, spoke with her about ideas for scanning and attention. Gave pt exercise ball.    OT Diagnosis:    OT Problem List:   OT Treatment Interventions:     OT Goals(current goals can now be found in the care plan section) Acute Rehab OT Goals Patient Stated Goal: not stated OT Goal Formulation: With patient Time For Goal Achievement: 10/22/12 Potential to Achieve Goals: Good ADL Goals Pt Will Perform Grooming: with modified independence;standing Pt Will Perform Lower Body Bathing: with modified independence;sit to/from stand Pt Will Perform Lower Body Dressing: with modified independence;sit to/from stand Pt Will Transfer to Toilet: with modified independence;ambulating;grab bars (comfort height toilet) Pt Will Perform Toileting - Clothing Manipulation and hygiene: with modified independence;sit to/from stand Pt Will Perform Tub/Shower Transfer: Tub transfer;with supervision;ambulating;rolling walker (tub equipment tbd) Additional ADL Goal #1: Pt will locate 3/3 grooming items on left side with min verbal cues.  Additional ADL Goal #2: Family will be independent in utilizing compensatory techniques for pt's left field deficit.  Additional ADL Goal #3: Pt/caregiver will be independent with HEP for LUE. Additional ADL Goal #4: Pt/caregiver will be independent in completing fine motor coordination activities  for LUE.  Visit  Information  Last OT Received On: 10/16/12 Assistance Needed: +1 History of Present Illness: Matthew Thornton is a 62 year old gentle man with multiple risk factors (cocaine, alcohol, smoking, hypertension) who comes in with new onset left sided facial droop, slurred speech, problems in coordination and gait, and weakness of limbs which seems to be getting better. He was diagnosed of having an acute right MCA infarct on MRI with no occlusion seen on MRA, but atherosclerotic disease seen, and very advanced chronic small vessel ischemia.    Subjective Data      Prior Functioning       Cognition  Cognition Arousal/Alertness: Awake/alert Behavior During Therapy: WFL for tasks assessed/performed Overall Cognitive Status: Impaired/Different from baseline Area of Impairment: Attention;Safety/judgement;Awareness Current Attention Level: Sustained Safety/Judgement: Decreased awareness of safety;Decreased awareness of deficits Awareness: Emergent General Comments:  Pt impulsive, demonstrates inattention and has poor self-monitoring.     Mobility  Bed Mobility Bed Mobility: Not assessed Transfers Transfers: Not assessed    Exercises      Balance     End of Session OT - End of Session Activity Tolerance: Patient tolerated treatment well Patient left: in chair;with family/visitor present  GO     Matthew Thornton OTR/L [phone] 10/16/2012, 5:59 PM

## 2012-10-16 NOTE — Progress Notes (Signed)
Physical Therapy Treatment Patient Details Name: Matthew Thornton MRN: [id number] DOB: [birth year] Today's Date: 10/16/2012 Time: 0865-7846 PT Time Calculation (min): 38 min  PT Assessment / Plan / Recommendation  History of Present Illness Matthew Thornton is a 62 year old gentle man with multiple risk factors (cocaine, alcohol, smoking, hypertension) who comes in with new onset left sided facial droop, slurred speech, problems in coordination and gait, and weakness of limbs which seems to be getting better. He was diagnosed of having an acute right MCA infarct on MRI with no occlusion seen on MRA, but atherosclerotic disease seen, and very advanced chronic small vessel ischemia.   PT Comments   Pt is progressing well with therapy today.  He continues to be a high fall risk due to decreased awareness of his balance and gait deficits.  He actually looks better without the RW because it acts as an obstacle for him and he is not safe using it.    Follow Up Recommendations  Home health PT;Supervision - Intermittent     Does the patient have the potential to tolerate intense rehabilitation   Yes  Barriers to Discharge   None      Equipment Recommendations  None recommended by PT    Recommendations for Other Services   None  Frequency Min 4X/week   Progress towards PT Goals Progress towards PT goals: Progressing toward goals  Plan Current plan remains appropriate    Precautions / Restrictions Precautions Precautions: Fall Precaution Comments: impulsive, quick to move, decreased awareness   Pertinent Vitals/Pain See vitals flow sheet.     Mobility  Bed Mobility Bed Mobility: Supine to Sit;Sitting - Scoot to Edge of Bed Supine to Sit: 6: Modified independent (Device/Increase time);With rails Sitting - Scoot to Edge of Bed: 6: Modified independent (Device/Increase time);With rail Details for Bed Mobility Assistance: Pt used railing for support when getting to sitting. He sat up on left side  of the bed and was awfully close to the left edge- pt unaware of this and needed cues for safety to ensure that he did not fall off of the bed.   Transfers Transfers: Sit to Stand;Stand to Sit Sit to Stand: 5: Supervision;With upper extremity assist;With armrests;From bed;From chair/3-in-1 Stand to Sit: 5: Supervision;With upper extremity assist;With armrests;To chair/3-in-1 Details for Transfer Assistance: supervision for safety due to balance deficits.  Ambulation/Gait Ambulation/Gait Assistance: 4: Min assist Ambulation Distance (Feet): 450 Feet Assistive device: Rolling walker;None Ambulation/Gait Assistance Details: 1/2 of gait with RW, 1/2 of gait with no assistive device.  Pt actually did better without RW as RW was acting as an obstacle for him and he was not using it safely.  He has most difficulty with left sided obstacles and with turning (90 and 180 degrees) as his feet often get tangled.  Wife and I discussed how carpet may change the appearance of his gait and that she needs to be with him at all times when walking.   Gait Pattern: Step-through pattern;Narrow base of support (almost scissoring at times. ) Gait velocity: too fast for safety.  He gets off balance and is moving too quickly and is too unaware to compensate.  Modified Rankin (Stroke Patients Only) Pre-Morbid Rankin Score: No symptoms Modified Rankin: Moderately severe disability      PT Goals (current goals can now be found in the care plan section) Acute Rehab PT Goals Patient Stated Goal: to get out of here today  Visit Information  Last PT Received On: 10/16/12 Assistance  Needed: +1 History of Present Illness: Matthew Thornton is a 62 year old gentle man with multiple risk factors (cocaine, alcohol, smoking, hypertension) who comes in with new onset left sided facial droop, slurred speech, problems in coordination and gait, and weakness of limbs which seems to be getting better. He was diagnosed of having an acute  right MCA infarct on MRI with no occlusion seen on MRA, but atherosclerotic disease seen, and very advanced chronic small vessel ischemia.    Subjective Data  Subjective: Pt reports other than his hand weakness/discoordination, he is "fine".   Patient Stated Goal: to get out of here today   Cognition  Cognition Arousal/Alertness: Awake/alert Behavior During Therapy: WFL for tasks assessed/performed Overall Cognitive Status: Impaired/Different from baseline Area of Impairment: Safety/judgement;Awareness Safety/Judgement: Decreased awareness of safety;Decreased awareness of deficits Awareness: Intellectual General Comments: Pt is aware of his left hand and arm limitations, but not of his balance and gait deficits, or left sided inattention.        End of Session PT - End of Session Equipment Utilized During Treatment: Gait belt Activity Tolerance: Patient tolerated treatment well Patient left: in chair;with call bell/phone within reach;with chair alarm set;with family/visitor present Nurse Communication: Mobility status (ok to move around room with wife's assist)    Lurena Joiner B. Diana Davenport, PT, DPT [phone]   10/16/2012, 11:47 AM

## 2012-10-16 NOTE — Progress Notes (Signed)
Speech Language Pathology Treatment Patient Details Name: KHARSON RASMUSSON MRN: [id number] DOB: [birth year] Today's Date: 10/16/2012 Time: 9518-8416 SLP Time Calculation (min): 16 min  Assessment / Plan / Recommendation Clinical Impression  Pt demonstrates need for frequent cues to follow multiple swallow strategies. Pt is easily distracted by environment and conversation. SLP offered moderate verbal cues for strategies and awareness of deficits with POs and attention to the left with basic functional task. Pt will need close supervision with meals to remain safe. Discussed with wife and offered compensatory strategies for attention and dysphagia. Wife verbalized understanding. Pt will need home health SLP if pt to d/c home.     SLP Plan  Continue with current plan of care    Pertinent Vitals/Pain NA  SLP Goals  SLP Goals Potential to Achieve Goals: Fair Potential Considerations: Co-morbidities;Cooperation/participation level;Previous level of function Progress/Goals/Alternative treatment plan discussed with pt/caregiver and they: Agree SLP Goal #1: Complete target functional problem solving tasks with moderate verbal and tactile cues to attend to the left SLP Goal #1 - Progress: Progressing toward goal SLP Goal #2: Demonstrate emergent and anticipatory awareness by correctly stating possible problems from current physical deficits when completing ADL's SLP Goal #2 - Progress: Progressing toward goal  General Temperature Spikes Noted: No Respiratory Status: Room air Behavior/Cognition: Alert;Cooperative;Pleasant mood;Confused;Decreased sustained attention;Distractible;Requires cueing Oral Cavity - Dentition: Adequate natural dentition Patient Positioning: Upright in bed  Oral Cavity - Oral Hygiene Does patient have any of the following "at risk" factors?: Other - dysphagia Brush patient's teeth BID with toothbrush (using toothpaste with fluoride): Yes Patient is AT RISK - Oral Care  Protocol followed (see row info): Yes   Treatment Treatment focused on: Cognition;Patient/family/caregiver education (dysphagia) Family/Caregiver Educated: wife Skilled Treatment: verbal, contextual cues for awareness, attention, effortful mutiple swallows.    GO     Labresha Mellor, Riley Nearing 10/16/2012, 4:02 PM
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
## 2012-10-16 NOTE — Progress Notes (Signed)
Stroke Team Progress Note  HISTORY Matthew Thornton is a 62 y.o. male that reports that he was well yesterday 10/13/2012. That evening while watching television he began to eat chips and was unable to swallow. When he started to drink he was unable to handle the fluid in his mouth. By the following morning, 10/14/2012, his symptoms had not improved and the patient presented for evaluation.   Date last known well: Date: 10/13/2012  Time last known well: Time: 21:00  tPA Given: No: Outside time window   SUBJECTIVE Wife present. Patient sitting on side of bed. No new complaints. Feeling "better".  OBJECTIVE Most recent Vital Signs: Filed Vitals:   10/16/12 0502 10/16/12 0959 10/16/12 1138 10/16/12 1400  BP: 175/101 162/92 177/93 168/97  Pulse: 70 68 68 63  Temp: 98.3 F (36.8 C) 98.1 F (36.7 C) 98 F (36.7 C) 98.4 F (36.9 C)  TempSrc: Oral Oral Oral Oral  Resp: 20 24 20 20   Height:      Weight:      SpO2: 98%  100% 97%   CBG (last 3)   Recent Labs  10/16/12 0423 10/16/12 0803 10/16/12 1134  GLUCAP 105* 128* 124*    IV Fluid Intake:      MEDICATIONS  . aspirin  300 mg Rectal Daily   Or  . aspirin  325 mg Oral Daily  . atorvastatin  40 mg Oral q1800  . gabapentin  600 mg Oral QID  . heparin  5,000 Units Subcutaneous Q8H  . influenza vac split quadrivalent PF  0.5 mL Intramuscular Tomorrow-1000  . insulin aspart  0-15 Units Subcutaneous Q4H  . lisinopril  40 mg Oral Daily   PRN:  chlorpheniramine-HYDROcodone, hydrALAZINE  Diet:  Dysphagia  Activity:  Bedrest  DVT Prophylaxis:  Subcutaneous heparin  CLINICALLY SIGNIFICANT STUDIES Basic Metabolic Panel:   Recent Labs Lab 10/14/12 0831 10/14/12 0851 10/15/12 0501  NA 136 141 138  K 4.4 4.3 4.4  CL 102 106 103  CO2 24  --  23  GLUCOSE 138* 143* 102*  BUN 9 9 11   CREATININE 1.10 1.10 1.10  CALCIUM 9.2  --  9.3   Liver Function Tests:   Recent Labs Lab 10/14/12 0831  AST 31  ALT 28  ALKPHOS 101   BILITOT 0.7  PROT 7.7  ALBUMIN 3.7   CBC:   Recent Labs Lab 10/14/12 0831 10/14/12 0851 10/15/12 0501  WBC 5.6  --  7.0  NEUTROABS 2.4  --   --   HGB 16.7 18.4* 16.9  HCT 49.1 54.0* 49.3  MCV 74.1*  --  74.0*  PLT 132*  --  140*   Coagulation:   Recent Labs Lab 10/14/12 0831  LABPROT 13.3  INR 1.03   Cardiac Enzymes:   Recent Labs Lab 10/14/12 0831 10/14/12 1949  TROPONINI <0.30 <0.30   Urinalysis:   Recent Labs Lab 10/14/12 0908  COLORURINE YELLOW  LABSPEC 1.004*  PHURINE 6.0  GLUCOSEU NEGATIVE  HGBUR NEGATIVE  BILIRUBINUR NEGATIVE  KETONESUR NEGATIVE  PROTEINUR NEGATIVE  UROBILINOGEN 1.0  NITRITE NEGATIVE  LEUKOCYTESUR SMALL*   Lipid Panel    Component Value Date/Time   CHOL 195 10/15/2012 0501   TRIG 93 10/15/2012 0501   HDL 38* 10/15/2012 0501   CHOLHDL 5.1 10/15/2012 0501   VLDL 19 10/15/2012 0501   LDLCALC 138* 10/15/2012 0501   HgbA1C  Lab Results  Component Value Date   HGBA1C 6.7* 10/15/2012    Urine Drug Screen:  Component Value Date/Time   LABOPIA NONE DETECTED 10/14/2012 0908   LABOPIA NEGATIVE 11/15/2007 1346   COCAINSCRNUR POSITIVE* 10/14/2012 0908   COCAINSCRNUR NEGATIVE 11/15/2007 1346   LABBENZ NONE DETECTED 10/14/2012 0908   LABBENZ NEGATIVE 11/15/2007 1346   AMPHETMU NONE DETECTED 10/14/2012 0908   AMPHETMU NEGATIVE 11/15/2007 1346   THCU POSITIVE* 10/14/2012 0908   LABBARB NONE DETECTED 10/14/2012 0908    Alcohol Level:   Recent Labs Lab 10/14/12 0831  ETH <11    Dg Chest 2 View 10/15/2012    Cardiomegaly, without congestive failure or acute disease.    Ct Head Wo Contrast 10/14/2012    No evidence of acute intracranial abnormality.  Extensive small vessel ischemic changes with bilateral basal ganglia lacunar infarcts.     MRI Brain Wo Contrast 10/14/2012    1. Acute right MCA white matter infarct. No mass effect or hemorrhage.  2. Underlying very advanced chronic small vessel ischemia.     MRA  Head 10/14/2012    1. Atherosclerosis versus thromboembolic disease at the distal right MCA M1 segment and bifurcation. Stenosis occurs but no major right MCA branch occlusion is identified.  2.  No other significant intracranial stenosis identified.    2D Echocardiogram  EF 45-50%, wall motion normal, LA moderately dilated. No ASD or PFO identified.  Carotid Doppler   Carotid Dopplers completed.  Preliminary report: There is 1-39% ICA stenosis. Vertebral artery flow is antegrade.   EKG  sinus rhythm rate 52 beats per minute.  Therapy Recommendations pending   Physical Exam   Neurologic Examination:  General - soft spoken 62 year old male in no acute distress Heart - Regular rate and rhythm - distant heart sounds Lungs - Clear to auscultation anteriorly Extremities - Distal pulses intact - no edema Skin - Warm and dry   Mental Status:  Alert, oriented, thought content appropriate. Speech fluent but slurred. Able to follow 3 step commands without difficulty.  Cranial Nerves:  II: Discs flat bilaterally; Visual fields grossly normal, pupils equal, round, reactive to light and accommodation  III,IV, VI: ptosis not present, extra-ocular motions intact bilaterally but patient gaze preference is to the right  V,VII: left facial droop, facial light touch sensation normal bilaterally  VIII: hearing normal bilaterally  IX,X: gag reflex reduced  XI: bilateral shoulder shrug  XII: midline tongue extension  Motor:  Right : Upper extremity 5/5 Left: Upper extremity 4/5  Lower extremity 5/5 Lower extremity 4/5  Tone and bulk:normal tone throughout; no atrophy noted  Sensory: Pinprick and light touch intact throughout, bilaterally  Deep Tendon Reflexes: 2+ in the upper extremities, trace at the knees and absent at the ankles  Plantars:  Right: mute Left: mute  Cerebellar:  Finger to nose with difficulty on the left possibly due to to weakness.  Gait: Deferred CV: pulses palpable  throughout    ASSESSMENT Mr. Matthew Thornton is a 62 y.o. male presenting with dysphagia and left hemiparesis. TPA was not given as the patient was outside of the time window. An MRI revealed an acute right MCA white matter infarct. Infarct felt to be thrombotic secondary to small vessel disease   On no antithrombotics prior to admission. Now on aspirin 325mg  daily for secondary stroke prevention. Patient with resultant left hemiparesis and dysphagia. Work up completed.   Hypertension  Diabetes mellitus - hemoglobin A1c 6.7  Cardiomyopathy  History of seizure disorder  Tobacco use, smoking cessation counseling  Polysubstance Abuse:  Urine drug screen positive for cocaine and  THC  Hyperlipidemia - LDL 138 cholesterol 195. No statins prior to admission. Now on Lipitor.  Hospital day # 2  The patient uses crack cocaine, and he had used this the day of the stroke.  TREATMENT/PLAN  Continue Aspirin suppository 300 mg daily for secondary stroke prevention until swallowing has been cleared.  Risk factor modification  Polysubstance abuse counseling  Follow up with Dr. Pearlean Brownie in 2 months  Stroke service will sign off. Discussed with primary team.  Gwendolyn Lima. Manson Passey, [facility], MBA, MHA [facility] Pager: [phone] 10/16/2012 4:02 PM  I have personally obtained a history, examined the patient, evaluated imaging results, and formulated the assessment and plan of care. I agree with the above. Delia Heady, MD

## 2012-10-17 DIAGNOSIS — F17200 Nicotine dependence, unspecified, uncomplicated: Secondary | ICD-10-CM

## 2012-10-17 DIAGNOSIS — F1410 Cocaine abuse, uncomplicated: Secondary | ICD-10-CM

## 2012-10-17 DIAGNOSIS — E785 Hyperlipidemia, unspecified: Secondary | ICD-10-CM

## 2012-10-17 LAB — GLUCOSE, CAPILLARY
Glucose-Capillary: 100 mg/dL — ABNORMAL HIGH (ref 70–99)
Glucose-Capillary: 113 mg/dL — ABNORMAL HIGH (ref 70–99)
Glucose-Capillary: 129 mg/dL — ABNORMAL HIGH (ref 70–99)
Glucose-Capillary: 184 mg/dL — ABNORMAL HIGH (ref 70–99)

## 2012-10-17 MED ORDER — AMLODIPINE BESYLATE 2.5 MG PO TABS: 2.5000 mg | ORAL_TABLET | Freq: Every day | ORAL | Status: DC

## 2012-10-17 MED ORDER — GLIPIZIDE 2.5 MG HALF TABLET: 2.5000 mg | ORAL_TABLET | Freq: Every day | ORAL | Status: DC

## 2012-10-17 MED ORDER — ASPIRIN 325 MG PO TABS: 325.0000 mg | ORAL_TABLET | Freq: Every day | ORAL | Status: AC

## 2012-10-17 MED ORDER — AMLODIPINE BESYLATE 2.5 MG PO TABS
2.5000 mg | ORAL_TABLET | Freq: Every day | ORAL | Status: DC
  Administered 2012-10-17: 2.5 mg via ORAL
  Filled 2012-10-17: qty 1

## 2012-10-17 MED ORDER — ATORVASTATIN CALCIUM 40 MG PO TABS: 40.0000 mg | ORAL_TABLET | Freq: Every day | ORAL | Status: AC

## 2012-10-17 MED ORDER — ATORVASTATIN CALCIUM 40 MG PO TABS: 40.0000 mg | ORAL_TABLET | Freq: Every day | ORAL | Status: DC

## 2012-10-17 MED ORDER — GLIPIZIDE 2.5 MG HALF TABLET: 2.5000 mg | ORAL_TABLET | Freq: Every day | ORAL | Status: AC

## 2012-10-17 MED ORDER — LISINOPRIL 40 MG PO TABS: 40.0000 mg | ORAL_TABLET | Freq: Every day | ORAL | Status: AC

## 2012-10-17 NOTE — Discharge Summary (Signed)
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 Carotid dopplers- 1-39% ICA stenosis. Vertebral artery flow is antegrade. (2). Echo- Mild atrial dilatation, normal valves, essentially no concerning abn (3) HBA1c- 6.7. (4). Lipid panel- LDL- 138. Pt also had dysphagia was evaluated by SLP and was placed on a dysphagia 2diet, which was continued on discharge. On discharge pt had mild Lt sided co-odination deficits, without weakness. Discharge home on Statins- started on this admission,aspirin- 325mg  orally,would worsen his HTN. Aspirin- 300 mg suppository.    # HTN- Patient was not compliant with his medication and uses cocaine on a regular basis. HCTZ was stopped bc it aggravated his incontinence and Atenolol was stopped due to cocaine use. Amlodipine and lisinopril were started and will need titrated up as an outpt.    # Hyperlipidemia- LDL- 138, Total- 195, HDL- 38.  -Started patient on a high intensity statin- atorvastatin- 40mg  daily.   # Cocaine and tobacco abuse- Patient said he was not ready for any intervention.   Discharge Vitals:   BP 139/85  Pulse 55  Temp(Src) 98.6 F (37 C) (Oral)  Resp 18  Ht 6'  3" (1.905 m)  Wt 209 lb 7 oz (95 kg)  BMI 26.18 kg/m2  SpO2 99%  Discharge Labs:  Results for orders placed during the hospital encounter of 10/14/12 (from the past 24 hour(s))  GLUCOSE, CAPILLARY     Status: Abnormal   Collection Time    10/16/12  4:15 PM      Result Value Range   Glucose-Capillary 123 (*) 70 - 99 mg/dL  GLUCOSE, CAPILLARY     Status: Abnormal   Collection Time    10/16/12  8:03 PM      Result Value Range   Glucose-Capillary 120 (*) 70 - 99 mg/dL  GLUCOSE, CAPILLARY     Status: Abnormal   Collection Time    10/17/12 12:03 AM      Result Value Range   Glucose-Capillary 184 (*) 70 - 99 mg/dL  GLUCOSE, CAPILLARY     Status: Abnormal   Collection Time    10/17/12  4:02 AM      Result Value Range   Glucose-Capillary 100 (*) 70 - 99 mg/dL  GLUCOSE, CAPILLARY     Status: Abnormal   Collection Time    10/17/12  7:57 AM      Result Value Range   Glucose-Capillary 129 (*) 70 - 99 mg/dL  GLUCOSE, CAPILLARY     Status: Abnormal   Collection Time    10/17/12 11:55 AM      Result Value Range   Glucose-Capillary 113 (*) 70 - 99 mg/dL    Signed: Kennis Carina, MD 10/17/2012, 1:03 PM   Time Spent on Discharge: 40 minutes Services Ordered on Discharge: Home Health SLP, aide, RN. Equipment Ordered on Discharge: Durable medical equipment.

## 2012-10-17 NOTE — Progress Notes (Signed)
Pt discharged home by MD. Discharge instructions reviewed with both patient and patient's wife. Instructed to follow all MD discharge instructions, take all medications as prescribed by MD, and keep all follow up appointments.Pt and  Pt's wife both verbalized understanding. IV removed, catheter intact, gauze dressing applied. All pt questions answered. Pt left floor via wheelchair accompanied by wife.    Zettie Cooley, RN
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
## 2012-10-17 NOTE — Care Management Note (Signed)
    Page 1 of 2   10/17/2012     2:07:50 PM   CARE MANAGEMENT NOTE 10/17/2012  Patient:  Matthew Thornton, Matthew Thornton   Account Number:  [id number]  Date Initiated:  10/16/2012  Documentation initiated by:  Elmer Bales  Subjective/Objective Assessment:   Patient admitted for CVA. Lives at home with wife.     Action/Plan:   will follow for discharge needs.   Anticipated DC Date:  10/16/2012   Anticipated DC Plan:  HOME W HOME HEALTH SERVICES         Choice offered to / List presented to:     DME arranged  3-N-1  TUB [facility]ENCH      DME agency  [facility]     HH arranged  HH-1 RN  HH-2 PT  HH-3 OT  HH-5 SPEECH THERAPY      HH agency  [facility]   Status of service:  Completed, signed off Medicare Important Message given?   (If response is "NO", the following Medicare IM given date fields will be blank) Date Medicare IM given:   Date Additional Medicare IM given:    Discharge Disposition:    Per UR Regulation:    If discussed at Long Length of Stay Meetings, dates discussed:    Comments:  10/17/12 1315 courtney Robarge RN, MSN, CM- Recieved call from Fuller Canada requesting that CM set up home health through the agency of patient's choice.  Spoke with patient's wife, who has chosen [facility]. Mary with [facility] was notified and accepted the referral, pending the willingness of the admitting physician's group to sign home health orders.  [facility] is unable to accept orders from [place] physicians. CM spoke iwth Dr Janalyn Harder, who verified that he would be willing to sign all home health orders. Mary with [facility] was notified. Scripts and progress notes were faxed to Felipe Drone LPN at same fax number.   10/17/12 1125 Elmer Bales RN, MSN, CM- DME orders faxed to [phone]. Will discuss discharge plans with patient and family once agency name is recieved.   10/17/12 1000 Elmer Bales RN, MSN, CM- Recieved return call from Fuller Canada regarding home health  orders.  Orders were faxed to [phone].  Ms Johnnette Barrios is aware that there are no definitive DME orders, CM spoke with therapist who will be seeing patient shortly to determine needs.  Ms Mock to contact CM with name and contact information for the home health agency that will be used.   10/16/12 1615 Elmer Bales RN, MSN, CM- Home health orders recieved. Message left for Fuller Canada to obtain fax number to send orders for anticipated discharge tomorrow.   10/16/12 1330 Elmer Bales RN, MSN, CM- Per Thornton, patient is now active with the [facility].  Transfer coordinator is April at [phone], ext [phone].  Patient follows at the [facility] outptient clinic.  Fuller Canada, CSW is the contact for all home health needs. Phone #[phone].  CM will continue to follow for discharge needs.  10/16/12 1100 Courtney Robarge RN, MSN, CM- Met with patient and wife to verify that patient is followed by the [facility].  [facility] [place] was contacted at [phone] and message was left with the transfer coordinator to obtain transfer request paperwork.  Awaiting return call.

## 2012-10-19 ENCOUNTER — Telehealth: Payer: Self-pay | Admitting: *Deleted

## 2012-10-19 NOTE — Telephone Encounter (Signed)
I agree with this order.  Thank you. 

## 2012-10-19 NOTE — Telephone Encounter (Signed)
[name] with [facility] [phone] called -aAlong with other services from [facility] - thought pt would benefit from OT also. Verbal OK given. Stanton Kidney Mekiah Wahler RN 10/19/12 4:10PM

## 2012-10-24 ENCOUNTER — Ambulatory Visit (INDEPENDENT_AMBULATORY_CARE_PROVIDER_SITE_OTHER): Payer: Non-veteran care | Admitting: Internal Medicine

## 2012-10-24 ENCOUNTER — Encounter: Payer: Self-pay | Admitting: Internal Medicine

## 2012-10-24 VITALS — BP 139/87 | HR 51 | Temp 97.6°F | Ht 73.5 in | Wt 218.0 lb

## 2012-10-24 DIAGNOSIS — I10 Essential (primary) hypertension: Secondary | ICD-10-CM

## 2012-10-24 DIAGNOSIS — I6350 Cerebral infarction due to unspecified occlusion or stenosis of unspecified cerebral artery: Secondary | ICD-10-CM

## 2012-10-24 DIAGNOSIS — I63511 Cerebral infarction due to unspecified occlusion or stenosis of right middle cerebral artery: Secondary | ICD-10-CM

## 2012-10-24 DIAGNOSIS — Z720 Tobacco use: Secondary | ICD-10-CM

## 2012-10-24 DIAGNOSIS — F17200 Nicotine dependence, unspecified, uncomplicated: Secondary | ICD-10-CM

## 2012-10-24 DIAGNOSIS — E119 Type 2 diabetes mellitus without complications: Secondary | ICD-10-CM | POA: Insufficient documentation

## 2012-10-24 MED ORDER — AMLODIPINE BESYLATE 5 MG PO TABS: 5.0000 mg | ORAL_TABLET | Freq: Every day | ORAL | Status: AC

## 2012-10-24 MED ORDER — AMLODIPINE BESYLATE 5 MG PO TABS: 5.0000 mg | ORAL_TABLET | Freq: Every day | ORAL | Status: DC

## 2012-10-24 NOTE — Assessment & Plan Note (Signed)
BP Readings from Last 3 Encounters:  10/24/12 139/87  10/17/12 139/85  07/04/11 126/82    Lab Results  Component Value Date   NA 138 10/15/2012   K 4.4 10/15/2012   CREATININE 1.10 10/15/2012    Assessment: Blood pressure control: mildly elevated Progress toward BP goal:  improved Comments: BP mildly elevated today.  Given that home readings have been higher, and the patient's recent stroke, we will be more aggressive in blood pressure control today.  Plan: Medications:  Increase amlodipine from 2.5 to 5 mg daily.  Continue lisinopril 40 daily Educational resources provided:   Self management tools provided:   [facility]

## 2012-10-24 NOTE — Assessment & Plan Note (Signed)
The patient notes improvement in functional status since hospital discharge. He continues to work with home PT, OT, and SLP. Residual false are still a problem, though it seems that those are more related to chronic orthopedic issues. -continue home PT, OT, SLP -risk factor modification

## 2012-10-24 NOTE — Progress Notes (Signed)
HPI The patient is a 62 y.o. 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  The patient was recently hospitalized 9/27 through 9/30 with an acute right MCA infarct, causing left-sided arm weakness and left facial droop, with mild dysphasia. Since hospitalization, the patient has been working with PT and OT at home, and notes some improvement in left arm strength and improvement in swallowing. The patient notes that he has fallen twice since coming home, once tripping over his pants while trying to get dressed, and once when walking back from the kitchen. In both of these instances, the patient experienced left knee pain and weakness, causing him to fall. The patient's left leg was unaffected by the stroke, but he notes that he has had occasional falls since a left hip replacement, with residual left knee weakness as a result of that.  No head injury or loss of consciousness.   The patient's blood pressure medications were adjusted during his hospitalization, his blood pressure is borderline high normal today. The patient's wife notes that at home, their home blood pressure cuff gives mostly readings in the 160s.   The patient continues to smoke one to 2 cigarettes per day. He is interested in quitting, but notes that he craves a cigarette when people around him are smoking. He has tried nicotine patches and nicotine gum in the past with no success. He reports that his psychiatrist at the [place] does not recommend bupropion or Chantix for him.   ROS: General: no fevers, chills, changes in weight, changes in appetite Skin: no rash HEENT: see HPI, no blurry vision, hearing changes, sore throat Pulm: no dyspnea, coughing, wheezing CV: no chest pain, palpitations, shortness of breath Abd: no abdominal pain, nausea/vomiting,  diarrhea/constipation GU: no dysuria, hematuria, polyuria Ext: no arthralgias, myalgias Neuro: see HPI  Filed Vitals:   10/24/12 0848  BP: 139/87  Pulse: 51  Temp: 97.6 F (36.4 C)    PEX General: alert, cooperative, and in no apparent distress HEENT: PERRL, EOMI, oropharynx non-erythematous Neck: supple, no lymphadenopathy Lungs: clear to ascultation bilaterally, normal work of respiration, no wheezes, rales, ronchi Heart: regular rate and rhythm, no murmurs, gallops, or rubs Abdomen: soft, non-tender, non-distended, normal bowel sounds Extremities: no cyanosis, clubbing, or edema.  Left lateral knee with small superficial abrasion, with no erythema, edema, or purulence Neurologic: alert & oriented X3, L lower facial droop noted, otherwise CN II-XII intact, strength 4/5 in left elbow flexion and left hand dorsiflexion, otherwise strength 5/5 throughout.  Sensation intact throughout.  Current Outpatient Prescriptions on File Prior to Visit  Medication Sig Dispense Refill  . albuterol (PROVENTIL HFA;VENTOLIN HFA) 108 (90 BASE) MCG/ACT inhaler Inhale 2 puffs into the lungs every 6 (six) hours as needed.        Marland Kitchen aspirin 325 MG tablet Take 1 tablet (325 mg total) by mouth daily.  30 tablet  6  . atorvastatin (LIPITOR) 40 MG tablet Take 1 tablet (40 mg total) by mouth daily at 6 PM.  30 tablet  6  . gabapentin (NEURONTIN) 600 MG tablet Take 600 mg by mouth 4 (four) times daily.        Marland Kitchen glipiZIDE (GLUCOTROL) 2.5 mg TABS tablet Take 0.5 tablets (2.5 mg total) by mouth daily before breakfast.  30 tablet  6  . lisinopril (  PRINIVIL,ZESTRIL) 40 MG tablet Take 1 tablet (40 mg total) by mouth daily.  30 tablet  6  . mirtazapine (REMERON) 15 MG tablet Take 15 mg by mouth at bedtime.      . naproxen (NAPROSYN) 500 MG tablet Take 500 mg by mouth 2 (two) times daily as needed (for pain).      . ranitidine (ZANTAC) 150 MG tablet Take 150 mg by mouth 2 (two) times daily.        . valACYclovir  (VALTREX) 500 MG tablet Take 500 mg by mouth 2 (two) times daily.       No current facility-administered medications on file prior to visit.    Assessment/Plan

## 2012-10-24 NOTE — Progress Notes (Signed)
Case discussed with Dr. Brown at the time of the visit.  We reviewed the resident's history and exam and pertinent patient test results.  I agree with the assessment, diagnosis and plan of care documented in the resident's note. 

## 2012-10-24 NOTE — Patient Instructions (Signed)
General Instructions: Your blood pressure is borderline elevated today. -we are increasing Amlodipine from 2.5 mg daily to 5 mg daily -continue to take lisinopril  Quitting smoking is the best choice you can make for your health.  Please review the enclosed literature.  Follow-up with your PCP at the [facility] as scheduled.   Treatment Goals:  Goals (1 Years of Data) as of 10/24/12   None      Progress Toward Treatment Goals:  Treatment Goal 10/24/2012  Blood pressure improved  Stop smoking smoking less  Prevent falls improved    Self Care Goals & Plans:  Self Care Goal 10/24/2012  Manage my medications take my medicines as prescribed  Monitor my health keep track of my blood glucose  Stop smoking call [facility] (1-800-QUIT-NOW)  Prevent falls wear appropriate shoes       Care Management & Community Referrals:  Referral 10/24/2012  Referrals made for care management support none needed

## 2012-10-24 NOTE — Assessment & Plan Note (Signed)
  Assessment: Progress toward smoking cessation:  smoking less Barriers to progress toward smoking cessation:  other tobacco users at home Comments: We discussed smoking cessation at length today. The patient appears interested in quitting.  Plan: Instruction/counseling given:  I counseled patient on the dangers of tobacco use, advised patient to stop smoking, and reviewed strategies to maximize success. Educational resources provided:  [employer]er, [employer]) brochure Self management tools provided:  smoking cessation plan (STAR Quit Plan) Medications to assist with smoking cessation:  None Patient agreed to the following self-care plans for smoking cessation: call [facility] (1-800-QUIT-NOW)  Other plans: We discussed avoidance of triggers, and other behavioral strategies to eliminate smoking. Patient given printed information on on smoking cessation.

## 2012-10-25 ENCOUNTER — Telehealth: Payer: Self-pay | Admitting: *Deleted

## 2012-10-25 NOTE — Telephone Encounter (Signed)
Call from [facility] Physical Therapist with [facility] - # [phone] She reports pt had a fall last night while undressing.  No injury noted.  He was asked not to dress while standing.

## 2012-10-25 NOTE — Telephone Encounter (Signed)
Thank you this note. As documented in my encounter yesterday, the patient has had a chronic problem with falls due to long-standing knee and hip orthopedic problems. Home health is currently working with this patient on strengthening exercises. This patient is not followed in our clinic regularly, and all further communication should be directed to the patient's PCP with the [facility].

## 2012-10-26 NOTE — Telephone Encounter (Signed)
PT called and informed to call pt's PCP in the future.

## 2013-01-03 ENCOUNTER — Ambulatory Visit: Payer: Non-veteran care | Attending: Family Medicine | Admitting: Rehabilitative and Restorative Service Providers"

## 2013-01-03 DIAGNOSIS — IMO0001 Reserved for inherently not codable concepts without codable children: Secondary | ICD-10-CM | POA: Insufficient documentation

## 2013-01-03 DIAGNOSIS — Z9181 History of falling: Secondary | ICD-10-CM | POA: Insufficient documentation

## 2013-01-03 DIAGNOSIS — I69998 Other sequelae following unspecified cerebrovascular disease: Secondary | ICD-10-CM | POA: Insufficient documentation

## 2013-01-03 DIAGNOSIS — M6281 Muscle weakness (generalized): Secondary | ICD-10-CM | POA: Insufficient documentation

## 2013-01-03 DIAGNOSIS — R269 Unspecified abnormalities of gait and mobility: Secondary | ICD-10-CM | POA: Insufficient documentation

## 2013-01-09 ENCOUNTER — Ambulatory Visit: Payer: Non-veteran care | Admitting: Physical Therapy

## 2013-01-15 ENCOUNTER — Ambulatory Visit: Payer: Non-veteran care | Admitting: Rehabilitative and Restorative Service Providers"

## 2013-01-17 ENCOUNTER — Ambulatory Visit: Payer: Non-veteran care | Admitting: Rehabilitative and Restorative Service Providers"

## 2013-01-19 ENCOUNTER — Ambulatory Visit: Payer: Non-veteran care | Attending: Family Medicine | Admitting: Rehabilitative and Restorative Service Providers"

## 2013-01-19 DIAGNOSIS — IMO0001 Reserved for inherently not codable concepts without codable children: Secondary | ICD-10-CM | POA: Insufficient documentation

## 2013-01-19 DIAGNOSIS — Z9181 History of falling: Secondary | ICD-10-CM | POA: Insufficient documentation

## 2013-01-19 DIAGNOSIS — M6281 Muscle weakness (generalized): Secondary | ICD-10-CM | POA: Insufficient documentation

## 2013-01-19 DIAGNOSIS — R269 Unspecified abnormalities of gait and mobility: Secondary | ICD-10-CM | POA: Insufficient documentation

## 2013-01-19 DIAGNOSIS — I69998 Other sequelae following unspecified cerebrovascular disease: Secondary | ICD-10-CM | POA: Insufficient documentation

## 2013-01-23 ENCOUNTER — Ambulatory Visit: Payer: Non-veteran care | Admitting: Physical Therapy

## 2013-01-25 ENCOUNTER — Ambulatory Visit: Payer: Non-veteran care | Admitting: Physical Therapy

## 2013-01-30 ENCOUNTER — Ambulatory Visit: Payer: Non-veteran care | Admitting: Rehabilitative and Restorative Service Providers"

## 2013-02-01 ENCOUNTER — Ambulatory Visit: Payer: Non-veteran care | Admitting: Physical Therapy

## 2013-02-06 ENCOUNTER — Ambulatory Visit: Payer: Non-veteran care | Admitting: Rehabilitative and Restorative Service Providers"

## 2013-02-08 ENCOUNTER — Ambulatory Visit: Payer: Non-veteran care | Admitting: Rehabilitative and Restorative Service Providers"

## 2013-02-13 ENCOUNTER — Ambulatory Visit: Payer: Non-veteran care | Admitting: Rehabilitative and Restorative Service Providers"

## 2013-02-15 ENCOUNTER — Ambulatory Visit: Payer: Non-veteran care | Admitting: Physical Therapy

## 2013-02-20 ENCOUNTER — Ambulatory Visit: Payer: Non-veteran care | Attending: Family Medicine | Admitting: Rehabilitative and Restorative Service Providers"

## 2013-02-20 DIAGNOSIS — R269 Unspecified abnormalities of gait and mobility: Secondary | ICD-10-CM | POA: Insufficient documentation

## 2013-02-20 DIAGNOSIS — IMO0001 Reserved for inherently not codable concepts without codable children: Secondary | ICD-10-CM | POA: Insufficient documentation

## 2013-02-20 DIAGNOSIS — M6281 Muscle weakness (generalized): Secondary | ICD-10-CM | POA: Insufficient documentation

## 2013-02-20 DIAGNOSIS — Z9181 History of falling: Secondary | ICD-10-CM | POA: Insufficient documentation

## 2013-02-20 DIAGNOSIS — I69998 Other sequelae following unspecified cerebrovascular disease: Secondary | ICD-10-CM | POA: Insufficient documentation

## 2013-02-22 ENCOUNTER — Ambulatory Visit: Payer: Non-veteran care | Admitting: Physical Therapy

## 2013-03-12 ENCOUNTER — Ambulatory Visit: Payer: Non-veteran care | Admitting: *Deleted

## 2013-03-19 ENCOUNTER — Ambulatory Visit: Payer: Non-veteran care | Attending: Physician Assistant | Admitting: *Deleted

## 2013-03-19 DIAGNOSIS — R269 Unspecified abnormalities of gait and mobility: Secondary | ICD-10-CM | POA: Insufficient documentation

## 2013-03-19 DIAGNOSIS — I69998 Other sequelae following unspecified cerebrovascular disease: Secondary | ICD-10-CM | POA: Insufficient documentation

## 2013-03-19 DIAGNOSIS — Z9181 History of falling: Secondary | ICD-10-CM | POA: Insufficient documentation

## 2013-03-19 DIAGNOSIS — M6281 Muscle weakness (generalized): Secondary | ICD-10-CM | POA: Insufficient documentation

## 2013-03-19 DIAGNOSIS — IMO0001 Reserved for inherently not codable concepts without codable children: Secondary | ICD-10-CM | POA: Insufficient documentation

## 2013-03-22 ENCOUNTER — Ambulatory Visit: Payer: Non-veteran care | Admitting: Occupational Therapy

## 2013-03-26 ENCOUNTER — Encounter: Payer: Non-veteran care | Admitting: *Deleted

## 2013-03-27 ENCOUNTER — Ambulatory Visit: Payer: Non-veteran care | Admitting: Occupational Therapy

## 2013-03-29 ENCOUNTER — Ambulatory Visit: Payer: Non-veteran care | Admitting: Occupational Therapy

## 2013-04-02 ENCOUNTER — Ambulatory Visit: Payer: Non-veteran care

## 2013-04-05 ENCOUNTER — Ambulatory Visit: Payer: Non-veteran care | Admitting: Occupational Therapy

## 2013-04-09 ENCOUNTER — Ambulatory Visit: Payer: Non-veteran care | Admitting: Occupational Therapy

## 2013-04-12 ENCOUNTER — Ambulatory Visit: Payer: Non-veteran care | Admitting: Occupational Therapy

## 2013-05-01 ENCOUNTER — Ambulatory Visit: Payer: Non-veteran care | Attending: Physician Assistant | Admitting: Occupational Therapy

## 2013-05-01 DIAGNOSIS — Z9181 History of falling: Secondary | ICD-10-CM | POA: Insufficient documentation

## 2013-05-01 DIAGNOSIS — I69998 Other sequelae following unspecified cerebrovascular disease: Secondary | ICD-10-CM | POA: Insufficient documentation

## 2013-05-01 DIAGNOSIS — IMO0001 Reserved for inherently not codable concepts without codable children: Secondary | ICD-10-CM | POA: Insufficient documentation

## 2013-05-01 DIAGNOSIS — M6281 Muscle weakness (generalized): Secondary | ICD-10-CM | POA: Insufficient documentation

## 2013-05-01 DIAGNOSIS — R269 Unspecified abnormalities of gait and mobility: Secondary | ICD-10-CM | POA: Insufficient documentation

## 2013-05-03 ENCOUNTER — Ambulatory Visit: Payer: Non-veteran care | Admitting: Occupational Therapy

## 2014-06-06 ENCOUNTER — Emergency Department (HOSPITAL_COMMUNITY): Payer: Non-veteran care

## 2014-06-06 ENCOUNTER — Emergency Department (HOSPITAL_COMMUNITY)
Admission: EM | Admit: 2014-06-06 | Discharge: 2014-06-06 | Disposition: A | Discharge status: Home / Self Care | Payer: Non-veteran care | Attending: Emergency Medicine | Admitting: Emergency Medicine

## 2014-06-06 DIAGNOSIS — G40909 Epilepsy, unspecified, not intractable, without status epilepticus: Secondary | ICD-10-CM | POA: Insufficient documentation

## 2014-06-06 DIAGNOSIS — I10 Essential (primary) hypertension: Secondary | ICD-10-CM | POA: Diagnosis not present

## 2014-06-06 DIAGNOSIS — Z7982 Long term (current) use of aspirin: Secondary | ICD-10-CM | POA: Diagnosis not present

## 2014-06-06 DIAGNOSIS — Z79899 Other long term (current) drug therapy: Secondary | ICD-10-CM | POA: Diagnosis not present

## 2014-06-06 DIAGNOSIS — R079 Chest pain, unspecified: Secondary | ICD-10-CM | POA: Diagnosis not present

## 2014-06-06 DIAGNOSIS — Z8673 Personal history of transient ischemic attack (TIA), and cerebral infarction without residual deficits: Secondary | ICD-10-CM | POA: Diagnosis not present

## 2014-06-06 DIAGNOSIS — R05 Cough: Secondary | ICD-10-CM | POA: Insufficient documentation

## 2014-06-06 DIAGNOSIS — Z8619 Personal history of other infectious and parasitic diseases: Secondary | ICD-10-CM | POA: Diagnosis not present

## 2014-06-06 DIAGNOSIS — K088 Other specified disorders of teeth and supporting structures: Secondary | ICD-10-CM | POA: Diagnosis present

## 2014-06-06 DIAGNOSIS — K047 Periapical abscess without sinus: Secondary | ICD-10-CM | POA: Diagnosis not present

## 2014-06-06 DIAGNOSIS — Z720 Tobacco use: Secondary | ICD-10-CM | POA: Diagnosis not present

## 2014-06-06 LAB — CBC WITH DIFFERENTIAL/PLATELET
BASOS PCT: 0 % (ref 0–1)
Basophils Absolute: 0 10*3/uL (ref 0.0–0.1)
Eosinophils Absolute: 0.2 10*3/uL (ref 0.0–0.7)
Eosinophils Relative: 3 % (ref 0–5)
HEMATOCRIT: 47 % (ref 39.0–52.0)
HEMOGLOBIN: 15.6 g/dL (ref 13.0–17.0)
Lymphocytes Relative: 43 % (ref 12–46)
Lymphs Abs: 3.1 10*3/uL (ref 0.7–4.0)
MCH: 24.5 pg — AB (ref 26.0–34.0)
MCHC: 33.2 g/dL (ref 30.0–36.0)
MCV: 73.7 fL — ABNORMAL LOW (ref 78.0–100.0)
Monocytes Absolute: 0.6 10*3/uL (ref 0.1–1.0)
Monocytes Relative: 8 % (ref 3–12)
Neutro Abs: 3.3 10*3/uL (ref 1.7–7.7)
Neutrophils Relative %: 46 % (ref 43–77)
Platelets: 148 10*3/uL — ABNORMAL LOW (ref 150–400)
RBC: 6.38 MIL/uL — ABNORMAL HIGH (ref 4.22–5.81)
RDW: 16.2 % — ABNORMAL HIGH (ref 11.5–15.5)
WBC: 7.2 10*3/uL (ref 4.0–10.5)

## 2014-06-06 LAB — I-STAT CHEM 8, ED
BUN: 17 mg/dL (ref 6–20)
CREATININE: 1.2 mg/dL (ref 0.61–1.24)
Calcium, Ion: 1.22 mmol/L (ref 1.13–1.30)
Chloride: 104 mmol/L (ref 101–111)
GLUCOSE: 146 mg/dL — AB (ref 65–99)
HCT: 51 % (ref 39.0–52.0)
Hemoglobin: 17.3 g/dL — ABNORMAL HIGH (ref 13.0–17.0)
Potassium: 4.3 mmol/L (ref 3.5–5.1)
SODIUM: 140 mmol/L (ref 135–145)
TCO2: 23 mmol/L (ref 0–100)

## 2014-06-06 LAB — PROTIME-INR
INR: 1.1 (ref 0.00–1.49)
Prothrombin Time: 14.4 seconds (ref 11.6–15.2)

## 2014-06-06 LAB — CK TOTAL AND CKMB (NOT AT ARMC)
CK, MB: 4.6 ng/mL (ref 0.5–5.0)
Relative Index: 2.3 (ref 0.0–2.5)
Total CK: 201 U/L (ref 49–397)

## 2014-06-06 LAB — APTT: aPTT: 32 seconds (ref 24–37)

## 2014-06-06 LAB — TROPONIN I: TROPONIN I: 0.03 ng/mL (ref <0.031)

## 2014-06-06 LAB — I-STAT TROPONIN, ED: Troponin i, poc: 0.02 ng/mL (ref 0.00–0.08)

## 2014-06-06 MED ORDER — HYDROCODONE-ACETAMINOPHEN 5-325 MG PO TABS
1.0000 | ORAL_TABLET | Freq: Once | ORAL | Status: AC
  Administered 2014-06-06: 1 via ORAL
  Filled 2014-06-06: qty 1

## 2014-06-06 MED ORDER — AMOXICILLIN 500 MG PO CAPS: 500.0000 mg | ORAL_CAPSULE | Freq: Three times a day (TID) | ORAL | Status: AC

## 2014-06-06 MED ORDER — AMOXICILLIN 500 MG PO CAPS
500.0000 mg | ORAL_CAPSULE | Freq: Once | ORAL | Status: AC
  Administered 2014-06-06: 500 mg via ORAL
  Filled 2014-06-06: qty 1

## 2014-06-06 MED ORDER — HYDROCODONE-ACETAMINOPHEN 5-325 MG PO TABS: 1.0000 | ORAL_TABLET | Freq: Four times a day (QID) | ORAL | Status: AC | PRN

## 2014-06-06 NOTE — ED Notes (Signed)
NP ordered pt be moved to acute side. Charge nurse notified.

## 2014-06-06 NOTE — ED Notes (Signed)
Wife demanding pt have pain meds now.NP informed. Order received.

## 2014-06-06 NOTE — ED Notes (Signed)
Pt c/o left lower dental pain. States a tooth 'broke off' 2 weeks ago. Started having pain last pm.

## 2014-06-06 NOTE — ED Notes (Signed)
New complaints reported to NP.

## 2014-06-06 NOTE — ED Provider Notes (Signed)
CSN: [id number]     Arrival date & time 06/06/14  1245 History   First MD Initiated Contact with Patient 06/06/14 1253     Chief Complaint  Patient presents with  . Dental Pain  . Chest Pain     (Consider location/radiation/quality/duration/timing/severity/associated sxs/prior Treatment) HPI Pt is a 64yo male with hx of HTN, pre-diabetes, cardiomyopathy, multiple lacunar infarcts, Hep C, tobacco abuse and cocaine abuse, presenting to ED with c/o Left sided chest pain.  Pt initially presented to ED with c/o left sided dental pain, he was seen in [facility] and prescribed amoxicillin and vicodin.  At end of visit in [facility], pt mentioned he had been having Left sided chest pain that started last night with his tooth pain. Pain was aching, sore, 8/10, worse with movement.  Pt states he was given pain medicine in [facility] and now the pain has resolved. Pt reports hx of strokes but denies any cardiac hx. States this does not feel similar to his stroke. Denies CP at this time. Denies SOB. Pt does report coughing a lot recently but denies fever, chills, congestion. Denies leg swelling or pain.  Denies heavy lifting, falls, or other trauma to left side of chest. Denies abdominal pain, n/v/d.   [facility]  [facility] Tuesday, 06/11/14  Past Medical History  Diagnosis Date  . Hypertension   . Pre-diabetes   . Cardiomyopathy   . Seizures   . Multiple lacunar infarcts   . Hepatitis C     S/p interferon therapy  . Tobacco abuse   . Cocaine abuse    Past Surgical History  Procedure Laterality Date  . Back surgery     No family history on file. History  Substance Use Topics  . Smoking status: Current Every Day Smoker -- 0.25 packs/day    Types: Cigarettes  . Smokeless tobacco: Never Used     Comment: 4 cigs/day.  . Alcohol Use: Yes     Comment: Whiskey sometimes.    Review of Systems  Constitutional: Negative  for fever, chills, diaphoresis, appetite change and fatigue.  HENT: Negative for congestion.   Respiratory: Positive for cough. Negative for shortness of breath.   Cardiovascular: Positive for chest pain (Left side). Negative for palpitations and leg swelling.  Gastrointestinal: Negative for nausea, vomiting, abdominal pain and diarrhea.  Musculoskeletal: Negative for myalgias and back pain.  All other systems reviewed and are negative.     Allergies  Morphine and related  Home Medications   Prior to Admission medications   Medication Sig Start Date End Date Taking? Authorizing Provider  albuterol (PROVENTIL HFA;VENTOLIN HFA) 108 (90 BASE) MCG/ACT inhaler Inhale 2 puffs into the lungs every 6 (six) hours as needed.      Historical Provider, MD  amLODipine (NORVASC) 5 MG tablet Take 1 tablet (5 mg total) by mouth daily. 10/24/12   Linward Headlandyan K Brown, MD  amoxicillin (AMOXIL) 500 MG capsule Take 1 capsule (500 mg total) by mouth 3 (three) times daily. 06/06/14   Hope Orlene OchM Neese, NP  aspirin 325 MG tablet Take 1 tablet (325 mg total) by mouth daily. 10/17/12   Ejiroghene Wendall StadeE Emokpae, MD  atorvastatin (LIPITOR) 40 MG tablet Take 1 tablet (40 mg total) by mouth daily at 6 PM. 10/17/12   Ejiroghene E Emokpae, MD  gabapentin (NEURONTIN) 600 MG tablet Take 600 mg by mouth 4 (four) times daily.  Historical Provider, MD  glipiZIDE (GLUCOTROL) 2.5 mg TABS tablet Take 0.5 tablets (2.5 mg total) by mouth daily before breakfast. 10/17/12   Ejiroghene E Mariea Clonts, MD  HYDROcodone-acetaminophen (NORCO) 5-325 MG per tablet Take 1 tablet by mouth every 6 (six) hours as needed for moderate pain. 06/06/14   Hope Orlene Och, NP  lisinopril (PRINIVIL,ZESTRIL) 40 MG tablet Take 1 tablet (40 mg total) by mouth daily. 10/17/12   Ejiroghene Wendall Stade, MD  mirtazapine (REMERON) 15 MG tablet Take 15 mg by mouth at bedtime.    Historical Provider, MD  naproxen (NAPROSYN) 500 MG tablet Take 500 mg by mouth 2 (two) times daily as needed  (for pain).    Historical Provider, MD  ranitidine (ZANTAC) 150 MG tablet Take 150 mg by mouth 2 (two) times daily.      Historical Provider, MD  valACYclovir (VALTREX) 500 MG tablet Take 500 mg by mouth 2 (two) times daily.    Historical Provider, MD   BP 152/96 mmHg  Pulse 62  Temp(Src) 98.4 F (36.9 C) (Oral)  Resp 16  SpO2 98% Physical Exam  Constitutional: He appears well-developed and well-nourished.  Pt lying in exam bed watching television, NAD  HENT:  Head: Normocephalic and atraumatic.  Eyes: Conjunctivae are normal. No scleral icterus.  Neck: Normal range of motion. Neck supple.  Cardiovascular: Normal rate, regular rhythm and normal heart sounds.   Pulmonary/Chest: Effort normal and breath sounds normal. No respiratory distress. He has no wheezes. He has no rales. He exhibits no tenderness.  Abdominal: Soft. Bowel sounds are normal. He exhibits no distension and no mass. There is no tenderness. There is no rebound and no guarding.  Musculoskeletal: Normal range of motion.  Neurological: He is alert.  Skin: Skin is warm and dry.  Nursing note and vitals reviewed.   ED Course  Procedures (including critical care time) Labs Review Labs Reviewed  CBC WITH DIFFERENTIAL/PLATELET - Abnormal; Notable for the following:    RBC 6.38 (*)    MCV 73.7 (*)    MCH 24.5 (*)    RDW 16.2 (*)    Platelets 148 (*)    All other components within normal limits  I-STAT CHEM 8, ED - Abnormal; Notable for the following:    Glucose, Bld 146 (*)    Hemoglobin 17.3 (*)    All other components within normal limits  CK TOTAL AND CKMB  TROPONIN I  PROTIME-INR  APTT  I-STAT TROPOININ, ED    Imaging Review Dg Chest 2 View  06/06/2014   CLINICAL DATA:  Right-sided chest pain  EXAM: CHEST  2 VIEW  COMPARISON:  10/14/2012  FINDINGS: Cardiac shadow remains enlarged. Tortuosity of the thoracic aorta is again seen. Mild interstitial changes are again noted. No focal infiltrate or sizable  effusion is seen. Degenerative changes of the thoracic spine are noted.  IMPRESSION: No acute abnormality noted.   Electronically Signed   By: Alcide Clever M.D.   On: 06/06/2014 15:19     EKG Interpretation   Date/Time:  Thursday Jun 06 2014 13:40:36 EDT Ventricular Rate:  63 PR Interval:  180 QRS Duration: 98 QT Interval:  432 QTC Calculation: 442 R Axis:   69 Text Interpretation:  Sinus rhythm with Premature atrial complexes Left  ventricular hypertrophy with repolarization abnormality more pronounced  t-wave inversion Confirmed by Anitra Lauth  MD, Alphonzo Lemmings ([id number]) on 06/06/2014  3:33:00 PM      MDM   Final diagnoses:  Dental abscess  Left sided  chest pain     Pt is a 64yo male initially see today for left sided dental pain, then reported having left sided chest pain. Pt is anxious to leave as CP has resolved, however, pt does have a moderate risk for major cardiac event with a HEART score of 4.   Pt does state [facility] for [facility] and would like to be discharged home.  EKG: no significant change since previous in 2014.   Labs: c/w previous. Doubt PE as pt denies SOB, O2 Sat 100%  istat troponin: negative for elevation.   Discussed pt with Dr. Anitra LauthPlunkett who also examined pt.  Pt is safe for discharge home as CP was constant since last night after eating a large bag of [employer].  CP resolved after PO pain medication today, which was given for his tooth pain.    Encouraged pt to keep appointment for stress test next week. Return precautions provided. Pt and wife verbalized understanding and agreement with tx plan.      Junius Finnerrin O'Malley, PA-C 06/06/14 2312  Gwyneth SproutWhitney Plunkett, MD 06/06/14 2340

## 2014-06-06 NOTE — ED Notes (Signed)
Pt denies CP at this time 

## 2014-06-06 NOTE — ED Provider Notes (Signed)
CSN: [id number]     Arrival date & time 06/06/14  1245 History   First MD Initiated Contact with Patient 06/06/14 1253     Chief Complaint  Patient presents with  . Dental Pain     (Consider location/radiation/quality/duration/timing/severity/associated sxs/prior Treatment) Patient is a 64 y.o. male presenting with tooth pain. The history is provided by the patient.  Dental Pain Location:  Lower Lower teeth location:  19/LL 1st molar Quality:  Throbbing Onset quality:  Gradual Duration:  2 weeks Timing:  Constant Progression:  Worsening Chronicity:  New Context: abscess   Associated symptoms: facial pain and facial swelling    64 y.o. male with hx of HTN, Cardiomyopathy, Seizures, Hepatitis C and cocaine abuse here today with hx of broken tooth 2 weeks ago that has become painful and today the gum is swollen and the pain is severe. He denies fever, chills, n/v or other problems today.   Past Medical History  Diagnosis Date  . Hypertension   . Pre-diabetes   . Cardiomyopathy   . Seizures   . Multiple lacunar infarcts   . Hepatitis C     S/p interferon therapy  . Tobacco abuse   . Cocaine abuse    Past Surgical History  Procedure Laterality Date  . Back surgery     No family history on file. History  Substance Use Topics  . Smoking status: Current Every Day Smoker -- 0.25 packs/day    Types: Cigarettes  . Smokeless tobacco: Never Used     Comment: 4 cigs/day.  . Alcohol Use: Yes     Comment: Whiskey sometimes.    Review of Systems  HENT: Positive for dental problem and facial swelling.   all other systems negative    Allergies  Morphine and related  Home Medications   Prior to Admission medications   Medication Sig Start Date End Date Taking? Authorizing Provider  albuterol (PROVENTIL HFA;VENTOLIN HFA) 108 (90 BASE) MCG/ACT inhaler Inhale 2 puffs into the lungs every 6 (six) hours as needed.      Historical Provider, MD  amLODipine (NORVASC) 5 MG tablet  Take 1 tablet (5 mg total) by mouth daily. 10/24/12   Linward Headlandyan K Brown, MD  amoxicillin (AMOXIL) 500 MG capsule Take 1 capsule (500 mg total) by mouth 3 (three) times daily. 06/06/14   Collier Monica Orlene OchM Krissy Orebaugh, NP  aspirin 325 MG tablet Take 1 tablet (325 mg total) by mouth daily. 10/17/12   Ejiroghene Wendall StadeE Emokpae, MD  atorvastatin (LIPITOR) 40 MG tablet Take 1 tablet (40 mg total) by mouth daily at 6 PM. 10/17/12   Ejiroghene E Emokpae, MD  gabapentin (NEURONTIN) 600 MG tablet Take 600 mg by mouth 4 (four) times daily.      Historical Provider, MD  glipiZIDE (GLUCOTROL) 2.5 mg TABS tablet Take 0.5 tablets (2.5 mg total) by mouth daily before breakfast. 10/17/12   Ejiroghene E Mariea ClontsEmokpae, MD  HYDROcodone-acetaminophen (NORCO) 5-325 MG per tablet Take 1 tablet by mouth every 6 (six) hours as needed for moderate pain. 06/06/14   Jaynia Fendley Orlene OchM Kyrah Schiro, NP  lisinopril (PRINIVIL,ZESTRIL) 40 MG tablet Take 1 tablet (40 mg total) by mouth daily. 10/17/12   Ejiroghene Wendall StadeE Emokpae, MD  mirtazapine (REMERON) 15 MG tablet Take 15 mg by mouth at bedtime.    Historical Provider, MD  naproxen (NAPROSYN) 500 MG tablet Take 500 mg by mouth 2 (two) times daily as needed (for pain).    Historical Provider, MD  ranitidine (ZANTAC) 150 MG tablet Take 150  mg by mouth 2 (two) times daily.      Historical Provider, MD  valACYclovir (VALTREX) 500 MG tablet Take 500 mg by mouth 2 (two) times daily.    Historical Provider, MD   BP 152/81 mmHg  Pulse 58  Temp(Src) 98.5 F (36.9 C) (Oral)  Resp 18  SpO2 100% Physical Exam  Constitutional: He is oriented to person, place, and time. He appears well-developed and well-nourished. No distress.  HENT:  Head: Normocephalic and atraumatic.  Right Ear: Tympanic membrane normal.  Left Ear: Tympanic membrane normal.  Mouth/Throat: Uvula is midline and mucous membranes are normal.    Left lower first molar broken and decayed into the gum. Tender on palpation. Gum surrounding the tooth is swollen and has erythema.     Eyes: EOM are normal.  Neck: Normal range of motion. Neck supple. No JVD present. Carotid bruit is not present.  Cardiovascular: Normal rate.   Pulmonary/Chest: Effort normal. No respiratory distress. He has no wheezes. He has no rales.  Abdominal: Soft. Bowel sounds are normal. There is no tenderness.  Musculoskeletal: Normal range of motion.  Lymphadenopathy:    He has cervical adenopathy.  Neurological: He is alert and oriented to person, place, and time. No cranial nerve deficit.  Skin: Skin is warm and dry.  Psychiatric: He has a normal mood and affect. His behavior is normal.  Nursing note and vitals reviewed.   ED Course  Procedures (including critical care time) Labs Review Labs Reviewed - No data to display  Imaging Review No results found.   EKG Interpretation None      MDM  64 y.o. male with broken tooth and dental abscess. Will treat for pain and infection.  Final diagnoses:  Dental abscess   At time of d/c a family member came in and told the nurse that the patient is having chest pain and needs to be worked up for his chest pain. I had asked the patient during reviews of systems about chest pain or other problems and he stated he had HTN and boarder line Diabetes but was not having any problems with pain other than his tooth. Now he states that the left side chest pain started the same time as the tooth pain but he forgot about it.  Unable to reproduce the pain with palpation of chest wall. EKG and labs ordered.  Will transfer from [facility] to main ED for chest pain evaluation.     FindlayHope M Taner Rzepka, NP 06/06/14 1351  Toy CookeyMegan Docherty, MD 06/06/14 [id number]

## 2014-06-06 NOTE — ED Notes (Signed)
Patient reports right sided rib pain with coughing and movement that started simultaneously with the abscess on the left throat. PA made aware.

## 2014-06-06 NOTE — Discharge Instructions (Signed)
°Emergency Department Resource Guide °1) Find a Doctor and Pay Out of Pocket °Although you won't have to find out who is covered by your insurance plan, it is a good idea to ask around and get recommendations. You will then need to call the office and see if the doctor you have chosen will accept you as a new patient and what types of options they offer for patients who are self-pay. Some doctors offer discounts or will set up payment plans for their patients who do not have insurance, but you will need to ask so you aren't surprised when you get to your appointment. ° °2) Contact Your Local Health Department °Not all health departments have doctors that can see patients for sick visits, but many do, so it is worth a call to see if yours does. If you don't know where your local health department is, you can check in your phone book. The CDC also has a tool to help you locate your state's health department, and many state websites also have listings of all of their local health departments. ° °3) Find a Walk-in Clinic °If your illness is not likely to be very severe or complicated, you may want to try a walk in clinic. These are popping up all over the country in pharmacies, drugstores, and shopping centers. They're usually staffed by nurse practitioners or physician assistants that have been trained to treat common illnesses and complaints. They're usually fairly quick and inexpensive. However, if you have serious medical issues or chronic medical problems, these are probably not your best option. ° °No Primary Care Doctor: °- Call [facility] at  832-8000 - they can help you locate a primary care doctor that  accepts your insurance, provides certain services, etc. °- Physician Referral Service- 1-800-533-3463 ° °Chronic Pain Problems: °Organization         Address  Phone   Notes  °[facility]  (336) 297-2271 Patients need to be referred by their primary care doctor.  ° °Medication  Assistance: °Organization         Address  Phone   Notes  °[place] [facility] [address] °[place], [place] [address] (336) 641-8030 --Must be a resident of [place] °-- Must have NO insurance coverage whatsoever (no Medicaid/ Medicare, etc.) °-- The pt. MUST have a primary care doctor that directs their care regularly and follows them in the community °  °[facility]  (866) 331-1348   °[employer]  (888) 892-1162   ° °Agencies that provide inexpensive medical care: °Organization         Address  Phone   Notes  °[facility]  (336) 832-8035   °[facility]    (336) 832-7272   °[facility] [address] °[place], [place] [address] (336) 832-4777   °[facility] [address], °[place] (336) 271-4999   °[facility]    (336) 373-0678   °[facility]    (336) 272-1050   °[facility] ° [address], [place] Phone:  (336) 832-4444, Fax:  (336) 832-4440 Hours of Operation:  9 am - 6 pm, M-F.  Also accepts Medicaid/Medicare and self-pay.  °[facility] ° [address], [place] Phone: (336) 832-3150, Fax: (336) [phone]. Hours of Operation:  8:30 am - 5:30 pm, M-F.  Also accepts Medicaid and self-pay.  °[facility] [place] [address], [place] Phone: (336) 878-6027   °[facility] [address], [place], [place] (336)723-1848, Ext. 123 Mondays & Thursdays: 7-9 AM.  First 15 patients are seen on a first come, first serve basis. °  ° °Medicaid-accepting [place] Providers: ° °Organization         Address  Phone   Notes  °[facility] [address], [place] (336) 641-2100 Also accepts self-pay patients.  °[facility] [address], [place] ° (336) 856-9996   °[facility] [address], [place]  (336) 288-8857   °[facility] [address] [place] [address], [place] (336) 299-7000   °Veita Bland [address], [place]  ° (336) 373-1557 Only accepts [place] Access Medicaid patients after they have their name applied to their card.  ° °Self-Pay (no insurance) in [place]: ° °Organization         Address  Phone   Notes  °Sickle Cell Patients, [facility] [address], [place] (336) 832-1970   °[facility] [address], [place] (336) 832-4400   °[facility] ° [address], [place] (336) 992-4800   °[facility]/Dr. Osei-Bonsu ° [address] [place] [address], [place] or [address], [place] (336) 841-8500 Phone number for both [place] and [place] locations is the same.  °[facility] [address], [place] (336) 299-0000   °[facility] [address] [place] [address], [place] or [address] (336) 852-7530 °(336) 878-2260   °[facility] [address], [place] (336) 350-1642, phone; (336) 294-5005, fax Sees patients 1st and 3rd Saturday of every month.  Must not qualify for public or private insurance (i.e. Medicaid, Medicare, [place] Health Choice, Veterans' Benefits) • Household income should be no more than 200% of the poverty level •The clinic cannot treat you if you are pregnant or think you are pregnant • Sexually transmitted diseases are not treated at the clinic.  ° ° °Dental Care: °Organization         Address  Phone  Notes  °[place] [facility] [address], [place] (336) 641-6152 Accepts children up to age 21 who are enrolled in Medicaid or [place] Health Choice; pregnant women with a Medicaid card; and children who have applied for Medicaid or [place] Health Choice, but were declined, whose parents can pay a reduced fee at time of service.  °[place]  [facility] [place]  [address], [place] (336) 641-7733 Accepts children up to age 21 who are enrolled in Medicaid or [place] Health Choice; pregnant women with a Medicaid card; and children who have applied for Medicaid or [place] Health Choice, but were declined, whose parents can pay a reduced fee at time of service.  °[facility] ° [address], [place] (336) 641-4533 Patients are seen by appointment only. Walk-ins are not accepted. [facility] will see patients 18 years of age and older. °Monday - Tuesday (8am-5pm) °Most Wednesdays (8:30-5pm) °$30 per visit, cash only  °[facility] ° [address], [place] (336) 641-4533 Patients are seen by appointment only. Walk-ins are not accepted. [facility] will see patients 18 years of age and older. °One   Wednesday Evening (Monthly: Volunteer Based).  $30 per visit, cash only  °[facility]  (919) [phone] for adults; Children under age 4, call Graduate Pediatric Dentistry at ([phone]) [phone]. Children aged 4-14, please call ([phone]) [phone] to request a pediatric application. ° Dental services are provided in all areas of dental care including fillings, crowns and bridges, complete and partial dentures, implants, gum treatment, root canals, and extractions. Preventive care is also provided. Treatment is provided to both adults and children. °Patients are selected via a lottery and there is often a waiting list. °  °[facility] [address], °[place] ° (336) 763-8833 [email or web address] °  °[facility] [address], [place], [place] (336)723-1848, Ext. 123 Second and Fourth Thursday of each month, opens at 6:30 AM; Clinic ends at 9 AM.  Patients are seen on a first-come first-served basis, and a limited number are seen during each clinic.  ° °[facility] ° [address], [place], [place] (336) 723-7904    Eligibility Requirements °You must have lived in [place], [place], or [place] counties for at least the last three months. °  You cannot be eligible for state or federal sponsored healthcare insurance, including Veterans Administration, Medicaid, or Medicare. °  You generally cannot be eligible for healthcare insurance through your employer.  °  How to apply: °Eligibility screenings are held every Tuesday and Wednesday afternoon from 1:00 pm until 4:00 pm. You do not need an appointment for the interview!  °[facility] [address], [place], [place] 336-631-2330   °[facility]  336-342-8273   °[facility]  336-703-3100   °[facility]  336-570-6415   ° °Behavioral Health Resources in the Community: °Intensive Outpatient Programs °Organization         Address  Phone  Notes  °[facility] [address], [place], [place] 336-878-6098   °[facility] [address], [place], [place] 336-832-9800   °[facility] [address], [place], [place] ° 336-882-2125   °[facility] [address],  °[place], [place] 1-800-853-5163 or 336-641-4981   °Substance Abuse Resources °Organization         Address  Phone  Notes  °[facility]  336-882-2125   °[facility]  336-784-9470   °[facility]  336-285-9073   °[facility]  336-845-3988   °Residential & Outpatient Substance Abuse Program  1-800-659-3381   °Psychological Services °Organization         Address  Phone  Notes  °[facility]  336- 832-9600   °[facility]  336- 378-7881   °[facility] [address], [place] 1-800-853-5163 or 336-641-4981   ° °Mobile Crisis Teams °Organization         Address  Phone  Notes  °[facility], Mobile Crisis Care Unit  1-877-626-1772   °Assertive °[facility] ° [address]  [place], [place] 336-834-9664   °Sharon DeEsch [address] °[place] 336-554-5454   ° °Self-Help/Support Groups °Organization         Address  Phone             Notes  °[facility]  - variety of support groups  336- 373-1402 Call for more information  °Narcotics Anonymous (NA), [facility] [address], °[place]  2 meetings at this location  ° °  Residential Treatment Programs Organization         Address  Phone  Notes  [facility] [address],    [place]  [phone]   [facility]  [address], [place] [phone], [place], [place] [phone]   [facility] [address], [place] [phone] Admissions: 8am-3pm M-F  [facility] [address]    [place], [place] [phone](450) [phone]   [facility] [address], [place], [place] [phone]   [facility] [address]  [place], [place] [phone]   [facility] Intensive Outpatient [address], [place], [place] [phone]   [facility] ([facility] [address]  [place], [place] [phone] or [phone]   [facility] ([facility]) [address] [place], [place] [phone] Accepts Medicaid  [facility] [address]  [place] [phone]) [phone] Substance Abuse/Addiction Treatment   [facility]         Address  Phone  Notes  [facility]  340-064-4315(888) 2133830393   Angie FavaJulie Brannon, PhD [address] [place], [place]   [phone](336) [phone] or [phone](336) [phone]   [facility]   [address] [place], [place] [phone](336) [phone]   [facility] [address], [place], [place] [phone](336) ([phone]) [phone] Insurance/Medicaid/sponsorship through [facility]  [facility] [address]                                    [place], [place] [phone](336) ([phone] Therapy/tele-psych/case    [facility] [address]   [place], [place] [phone](336) [phone]    Dr. Lolly MustacheArfeen  [phone](336) [phone]   [facility]  [employer] [facility] 1) [address], [place] 2) [address], [place] 3)  [address], [place] ([phone](336) ([phone](336) [phone]  [phone](336) [phone]   [facility] Child Abuse Hotline [phone](336) 862-436-2148 or 365-821-3497(336) [phone] (After Hours)       Dental Abscess A dental abscess is a collection of infected fluid (pus) from a bacterial infection in the inner part of the tooth (pulp). It usually occurs at the end of the tooth's root.  CAUSES   Severe tooth decay.  Trauma to the tooth that allows bacteria to enter into the pulp, such as a broken or chipped tooth. SYMPTOMS   Severe pain in and around the infected tooth.  Swelling and redness around the abscessed tooth or in the mouth or face.  Tenderness.  Pus drainage.  Bad breath.  Bitter taste in the mouth.  Difficulty swallowing.  Difficulty opening the mouth.  Nausea.  Vomiting.  Chills.  Swollen neck glands. DIAGNOSIS   A medical and dental history will be taken.  An examination will be performed by tapping on the abscessed tooth.  X-rays may be taken of the tooth to identify the abscess. TREATMENT The goal of treatment is to eliminate the infection. You may be prescribed antibiotic medicine to stop the infection from spreading. A root canal may be performed to save the tooth. If the tooth cannot be saved, it may be pulled (extracted) and the abscess may be drained.  HOME CARE INSTRUCTIONS  Only take over-the-counter or prescription medicines for pain, fever, or discomfort as directed by your caregiver.  Rinse your mouth (gargle) often with salt  water ( tsp salt in 8 oz [250 ml] of warm water) to relieve pain or swelling.  Do not drive after taking pain medicine (narcotics).  Do not apply heat to the outside of your face.  Return to your dentist for  further treatment as directed. SEEK MEDICAL CARE IF:  Your pain is not helped by medicine.  Your pain is getting worse instead of better. SEEK IMMEDIATE MEDICAL CARE IF:  You have a fever or persistent symptoms for more than 2-3 days.  You have a fever and your symptoms suddenly get worse.  You have chills or a very bad headache.  You have problems breathing or swallowing.  You have trouble opening your mouth.  You have swelling in the neck or around the eye. Document Released: 01/04/2005 Document Revised: 09/29/2011 Document Reviewed: 04/14/2010 [facility] Patient Information 2015 [place]. This information is not intended to replace advice given to you by your health care provider. Make sure you discuss any questions you have with your health care provider.

## 2014-06-06 NOTE — ED Notes (Signed)
Molar lower left side broken off; pt reports swelling. Been going on since yesterday.

## 2014-12-20 ENCOUNTER — Emergency Department (HOSPITAL_COMMUNITY)
Admission: EM | Admit: 2014-12-20 | Discharge: 2014-12-20 | Disposition: A | Discharge status: Home / Self Care | Payer: Medicare Other | Attending: Emergency Medicine | Admitting: Emergency Medicine

## 2014-12-20 ENCOUNTER — Emergency Department (HOSPITAL_COMMUNITY): Payer: Medicare Other

## 2014-12-20 ENCOUNTER — Encounter (HOSPITAL_COMMUNITY): Payer: Self-pay | Admitting: Family Medicine

## 2014-12-20 DIAGNOSIS — S0232XA Fracture of orbital floor, left side, initial encounter for closed fracture: Secondary | ICD-10-CM

## 2014-12-20 DIAGNOSIS — F17210 Nicotine dependence, cigarettes, uncomplicated: Secondary | ICD-10-CM | POA: Insufficient documentation

## 2014-12-20 DIAGNOSIS — H1132 Conjunctival hemorrhage, left eye: Secondary | ICD-10-CM | POA: Insufficient documentation

## 2014-12-20 DIAGNOSIS — Z791 Long term (current) use of non-steroidal anti-inflammatories (NSAID): Secondary | ICD-10-CM | POA: Insufficient documentation

## 2014-12-20 DIAGNOSIS — I10 Essential (primary) hypertension: Secondary | ICD-10-CM | POA: Diagnosis not present

## 2014-12-20 DIAGNOSIS — Y9289 Other specified places as the place of occurrence of the external cause: Secondary | ICD-10-CM | POA: Diagnosis not present

## 2014-12-20 DIAGNOSIS — Y9389 Activity, other specified: Secondary | ICD-10-CM | POA: Insufficient documentation

## 2014-12-20 DIAGNOSIS — Z7982 Long term (current) use of aspirin: Secondary | ICD-10-CM | POA: Insufficient documentation

## 2014-12-20 DIAGNOSIS — S0512XA Contusion of eyeball and orbital tissues, left eye, initial encounter: Secondary | ICD-10-CM

## 2014-12-20 DIAGNOSIS — Y998 Other external cause status: Secondary | ICD-10-CM | POA: Diagnosis not present

## 2014-12-20 DIAGNOSIS — H209 Unspecified iridocyclitis: Secondary | ICD-10-CM | POA: Diagnosis not present

## 2014-12-20 DIAGNOSIS — S0592XA Unspecified injury of left eye and orbit, initial encounter: Secondary | ICD-10-CM | POA: Diagnosis present

## 2014-12-20 DIAGNOSIS — Z8619 Personal history of other infectious and parasitic diseases: Secondary | ICD-10-CM | POA: Diagnosis not present

## 2014-12-20 DIAGNOSIS — Z79899 Other long term (current) drug therapy: Secondary | ICD-10-CM | POA: Insufficient documentation

## 2014-12-20 LAB — CBG MONITORING, ED: GLUCOSE-CAPILLARY: 185 mg/dL — AB (ref 65–99)

## 2014-12-20 MED ORDER — TETRACAINE HCL 0.5 % OP SOLN
2.0000 [drp] | Freq: Once | OPHTHALMIC | Status: AC
  Administered 2014-12-20: 2 [drp] via OPHTHALMIC
  Filled 2014-12-20 (×2): qty 2

## 2014-12-20 MED ORDER — FENTANYL CITRATE (PF) 100 MCG/2ML IJ SOLN
50.0000 ug | Freq: Once | INTRAMUSCULAR | Status: AC
  Administered 2014-12-20: 50 ug via INTRAVENOUS
  Filled 2014-12-20: qty 2

## 2014-12-20 MED ORDER — FLUORESCEIN SODIUM 1 MG OP STRP
1.0000 | ORAL_STRIP | Freq: Once | OPHTHALMIC | Status: AC
  Administered 2014-12-20: 1 via OPHTHALMIC
  Filled 2014-12-20: qty 1

## 2014-12-20 MED ORDER — PREDNISOLONE ACETATE 1 % OP SUSP: 1.0000 [drp] | Freq: Four times a day (QID) | OPHTHALMIC | Status: AC

## 2014-12-20 MED ORDER — CYCLOPENTOLATE HCL 1 % OP SOLN: 1.0000 [drp] | Freq: Three times a day (TID) | OPHTHALMIC | Status: AC
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
## 2014-12-20 NOTE — ED Notes (Signed)
Pt stable, ambulatory, states understanding of discharge instructions 

## 2014-12-24 ENCOUNTER — Encounter (HOSPITAL_COMMUNITY): Payer: Self-pay | Admitting: Emergency Medicine

## 2014-12-24 ENCOUNTER — Emergency Department (HOSPITAL_COMMUNITY)
Admission: EM | Admit: 2014-12-24 | Discharge: 2014-12-24 | Disposition: A | Discharge status: Home / Self Care | Payer: Medicare Other | Attending: Emergency Medicine | Admitting: Emergency Medicine

## 2014-12-24 DIAGNOSIS — Z79899 Other long term (current) drug therapy: Secondary | ICD-10-CM | POA: Insufficient documentation

## 2014-12-24 DIAGNOSIS — I10 Essential (primary) hypertension: Secondary | ICD-10-CM | POA: Diagnosis not present

## 2014-12-24 DIAGNOSIS — H209 Unspecified iridocyclitis: Secondary | ICD-10-CM | POA: Insufficient documentation

## 2014-12-24 DIAGNOSIS — F17210 Nicotine dependence, cigarettes, uncomplicated: Secondary | ICD-10-CM | POA: Diagnosis not present

## 2014-12-24 DIAGNOSIS — Z7982 Long term (current) use of aspirin: Secondary | ICD-10-CM | POA: Diagnosis not present

## 2014-12-24 DIAGNOSIS — H5712 Ocular pain, left eye: Secondary | ICD-10-CM | POA: Diagnosis present

## 2014-12-24 DIAGNOSIS — Z87828 Personal history of other (healed) physical injury and trauma: Secondary | ICD-10-CM | POA: Insufficient documentation

## 2014-12-24 DIAGNOSIS — Z8619 Personal history of other infectious and parasitic diseases: Secondary | ICD-10-CM | POA: Insufficient documentation

## 2014-12-24 DIAGNOSIS — Z7952 Long term (current) use of systemic steroids: Secondary | ICD-10-CM | POA: Diagnosis not present

## 2014-12-24 MED ORDER — TETRACAINE HCL 0.5 % OP SOLN
2.0000 [drp] | Freq: Once | OPHTHALMIC | Status: AC
  Administered 2014-12-24: 2 [drp] via OPHTHALMIC
  Filled 2014-12-24: qty 2

## 2014-12-24 MED ORDER — FLUORESCEIN SODIUM 1 MG OP STRP
1.0000 | ORAL_STRIP | Freq: Once | OPHTHALMIC | Status: AC
  Administered 2014-12-24: 1 via OPHTHALMIC
  Filled 2014-12-24: qty 1

## 2014-12-24 NOTE — ED Notes (Signed)
Pt transported to eye exam room per MD request. MD has fluorescein strip in possession for exam.

## 2014-12-24 NOTE — ED Notes (Signed)
Pt to ER with continued complaint of eye pain and drainage since Friday. Pt seen here Friday and treated, sent home with prescriptions and to follow up with PCP. Pt is unable to see PCP due to PCP being out of town. Pt eye pain and drainage initially got better Saturday and Sunday but since yesterday has been worsening. Pt has obvious drainage to left eye and eye is reddened.MD Fayrene FearingJames at bedside administering tetracaine drops to exam eyes better.

## 2014-12-24 NOTE — Discharge Instructions (Signed)
To Dr. Eliane DecreePatel's office  [address]  [address] [place] [place] with [facility] on the building First floor, [address]
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
## 2015-04-03 ENCOUNTER — Emergency Department (HOSPITAL_COMMUNITY): Payer: Medicare PPO

## 2015-04-03 ENCOUNTER — Emergency Department (HOSPITAL_COMMUNITY)
Admission: EM | Admit: 2015-04-03 | Discharge: 2015-04-03 | Disposition: A | Discharge status: Home / Self Care | Payer: Medicare PPO | Attending: Emergency Medicine | Admitting: Emergency Medicine

## 2015-04-03 ENCOUNTER — Encounter (HOSPITAL_COMMUNITY): Payer: Self-pay

## 2015-04-03 ENCOUNTER — Other Ambulatory Visit: Payer: Self-pay

## 2015-04-03 DIAGNOSIS — F17210 Nicotine dependence, cigarettes, uncomplicated: Secondary | ICD-10-CM | POA: Insufficient documentation

## 2015-04-03 DIAGNOSIS — I10 Essential (primary) hypertension: Secondary | ICD-10-CM | POA: Insufficient documentation

## 2015-04-03 DIAGNOSIS — Z8673 Personal history of transient ischemic attack (TIA), and cerebral infarction without residual deficits: Secondary | ICD-10-CM | POA: Diagnosis not present

## 2015-04-03 DIAGNOSIS — R51 Headache: Secondary | ICD-10-CM | POA: Insufficient documentation

## 2015-04-03 DIAGNOSIS — I679 Cerebrovascular disease, unspecified: Secondary | ICD-10-CM | POA: Insufficient documentation

## 2015-04-03 DIAGNOSIS — R9431 Abnormal electrocardiogram [ECG] [EKG]: Secondary | ICD-10-CM | POA: Insufficient documentation

## 2015-04-03 DIAGNOSIS — G319 Degenerative disease of nervous system, unspecified: Secondary | ICD-10-CM | POA: Diagnosis not present

## 2015-04-03 LAB — DIFFERENTIAL
BASOS PCT: 1 %
Basophils Absolute: 0.1 10*3/uL (ref 0.0–0.1)
EOS ABS: 0.3 10*3/uL (ref 0.0–0.7)
EOS PCT: 5 %
Lymphocytes Relative: 48 %
Lymphs Abs: 2.8 10*3/uL (ref 0.7–4.0)
MONO ABS: 0.4 10*3/uL (ref 0.1–1.0)
MONOS PCT: 7 %
Neutro Abs: 2.2 10*3/uL (ref 1.7–7.7)
Neutrophils Relative %: 39 %

## 2015-04-03 LAB — COMPREHENSIVE METABOLIC PANEL
ALT: 27 U/L (ref 17–63)
ANION GAP: 11 (ref 5–15)
AST: 33 U/L (ref 15–41)
Albumin: 3.5 g/dL (ref 3.5–5.0)
Alkaline Phosphatase: 77 U/L (ref 38–126)
BUN: 14 mg/dL (ref 6–20)
CO2: 23 mmol/L (ref 22–32)
CREATININE: 1.23 mg/dL (ref 0.61–1.24)
Calcium: 9.2 mg/dL (ref 8.9–10.3)
Chloride: 103 mmol/L (ref 101–111)
GFR, EST NON AFRICAN AMERICAN: 59 mL/min — AB (ref >60)
Glucose, Bld: 169 mg/dL — ABNORMAL HIGH (ref 65–99)
POTASSIUM: 4.5 mmol/L (ref 3.5–5.1)
SODIUM: 137 mmol/L (ref 135–145)
Total Bilirubin: 1 mg/dL (ref 0.3–1.2)
Total Protein: 7 g/dL (ref 6.5–8.1)

## 2015-04-03 LAB — CBC
HEMATOCRIT: 47.5 % (ref 39.0–52.0)
Hemoglobin: 15.9 g/dL (ref 13.0–17.0)
MCH: 24.8 pg — AB (ref 26.0–34.0)
MCHC: 33.5 g/dL (ref 30.0–36.0)
MCV: 74 fL — ABNORMAL LOW (ref 78.0–100.0)
PLATELETS: 165 10*3/uL (ref 150–400)
RBC: 6.42 MIL/uL — ABNORMAL HIGH (ref 4.22–5.81)
RDW: 16.6 % — AB (ref 11.5–15.5)
WBC: 5.8 10*3/uL (ref 4.0–10.5)

## 2015-04-03 LAB — PROTIME-INR
INR: 1.13 (ref 0.00–1.49)
PROTHROMBIN TIME: 14.7 s (ref 11.6–15.2)

## 2015-04-03 LAB — APTT: aPTT: 33 seconds (ref 24–37)

## 2015-04-03 LAB — I-STAT TROPONIN, ED: Troponin i, poc: 0.03 ng/mL (ref 0.00–0.08)

## 2015-04-03 NOTE — ED Notes (Signed)
Patient states he did not want to wait.   Patient went home.
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
## 2016-02-01 ENCOUNTER — Inpatient Hospital Stay (HOSPITAL_COMMUNITY): Payer: Medicare PPO

## 2016-02-01 ENCOUNTER — Encounter (HOSPITAL_COMMUNITY): Payer: Self-pay | Admitting: Emergency Medicine

## 2016-02-01 ENCOUNTER — Emergency Department (HOSPITAL_COMMUNITY): Payer: Medicare PPO

## 2016-02-01 ENCOUNTER — Inpatient Hospital Stay (HOSPITAL_COMMUNITY)
Admission: EM | Admit: 2016-02-01 | Discharge: 2016-02-19 | DRG: 064 | Disposition: E | Payer: Medicare PPO | Attending: Pulmonary Disease | Admitting: Pulmonary Disease

## 2016-02-01 DIAGNOSIS — R402122 Coma scale, eyes open, to pain, at arrival to emergency department: Secondary | ICD-10-CM | POA: Diagnosis present

## 2016-02-01 DIAGNOSIS — F17210 Nicotine dependence, cigarettes, uncomplicated: Secondary | ICD-10-CM | POA: Diagnosis present

## 2016-02-01 DIAGNOSIS — E1165 Type 2 diabetes mellitus with hyperglycemia: Secondary | ICD-10-CM | POA: Diagnosis present

## 2016-02-01 DIAGNOSIS — G9382 Brain death: Secondary | ICD-10-CM | POA: Diagnosis not present

## 2016-02-01 DIAGNOSIS — E440 Moderate protein-calorie malnutrition: Secondary | ICD-10-CM | POA: Diagnosis not present

## 2016-02-01 DIAGNOSIS — B1920 Unspecified viral hepatitis C without hepatic coma: Secondary | ICD-10-CM | POA: Diagnosis present

## 2016-02-01 DIAGNOSIS — I6300 Cerebral infarction due to thrombosis of unspecified precerebral artery: Secondary | ICD-10-CM

## 2016-02-01 DIAGNOSIS — H518 Other specified disorders of binocular movement: Secondary | ICD-10-CM | POA: Diagnosis present

## 2016-02-01 DIAGNOSIS — Z6827 Body mass index (BMI) 27.0-27.9, adult: Secondary | ICD-10-CM

## 2016-02-01 DIAGNOSIS — Z515 Encounter for palliative care: Secondary | ICD-10-CM | POA: Diagnosis not present

## 2016-02-01 DIAGNOSIS — Z7982 Long term (current) use of aspirin: Secondary | ICD-10-CM

## 2016-02-01 DIAGNOSIS — I63512 Cerebral infarction due to unspecified occlusion or stenosis of left middle cerebral artery: Secondary | ICD-10-CM | POA: Diagnosis not present

## 2016-02-01 DIAGNOSIS — I161 Hypertensive emergency: Secondary | ICD-10-CM

## 2016-02-01 DIAGNOSIS — I429 Cardiomyopathy, unspecified: Secondary | ICD-10-CM | POA: Diagnosis present

## 2016-02-01 DIAGNOSIS — R402322 Coma scale, best motor response, extension, at arrival to emergency department: Secondary | ICD-10-CM | POA: Diagnosis present

## 2016-02-01 DIAGNOSIS — J690 Pneumonitis due to inhalation of food and vomit: Secondary | ICD-10-CM

## 2016-02-01 DIAGNOSIS — E663 Overweight: Secondary | ICD-10-CM | POA: Diagnosis present

## 2016-02-01 DIAGNOSIS — J9601 Acute respiratory failure with hypoxia: Secondary | ICD-10-CM | POA: Diagnosis present

## 2016-02-01 DIAGNOSIS — R402212 Coma scale, best verbal response, none, at arrival to emergency department: Secondary | ICD-10-CM | POA: Diagnosis present

## 2016-02-01 DIAGNOSIS — E874 Mixed disorder of acid-base balance: Secondary | ICD-10-CM | POA: Diagnosis present

## 2016-02-01 DIAGNOSIS — I639 Cerebral infarction, unspecified: Secondary | ICD-10-CM | POA: Diagnosis present

## 2016-02-01 DIAGNOSIS — R680 Hypothermia, not associated with low environmental temperature: Secondary | ICD-10-CM | POA: Diagnosis not present

## 2016-02-01 DIAGNOSIS — H5704 Mydriasis: Secondary | ICD-10-CM | POA: Diagnosis present

## 2016-02-01 DIAGNOSIS — G935 Compression of brain: Secondary | ICD-10-CM | POA: Diagnosis not present

## 2016-02-01 DIAGNOSIS — R4189 Other symptoms and signs involving cognitive functions and awareness: Secondary | ICD-10-CM

## 2016-02-01 DIAGNOSIS — G9340 Encephalopathy, unspecified: Secondary | ICD-10-CM

## 2016-02-01 DIAGNOSIS — I63412 Cerebral infarction due to embolism of left middle cerebral artery: Secondary | ICD-10-CM | POA: Diagnosis present

## 2016-02-01 DIAGNOSIS — I110 Hypertensive heart disease with heart failure: Secondary | ICD-10-CM | POA: Diagnosis present

## 2016-02-01 DIAGNOSIS — Z66 Do not resuscitate: Secondary | ICD-10-CM | POA: Diagnosis present

## 2016-02-01 DIAGNOSIS — E785 Hyperlipidemia, unspecified: Secondary | ICD-10-CM | POA: Diagnosis present

## 2016-02-01 DIAGNOSIS — F1410 Cocaine abuse, uncomplicated: Secondary | ICD-10-CM | POA: Diagnosis present

## 2016-02-01 DIAGNOSIS — I5042 Chronic combined systolic (congestive) and diastolic (congestive) heart failure: Secondary | ICD-10-CM | POA: Diagnosis present

## 2016-02-01 DIAGNOSIS — I509 Heart failure, unspecified: Secondary | ICD-10-CM

## 2016-02-01 DIAGNOSIS — R569 Unspecified convulsions: Secondary | ICD-10-CM

## 2016-02-01 DIAGNOSIS — Z885 Allergy status to narcotic agent status: Secondary | ICD-10-CM

## 2016-02-01 DIAGNOSIS — G8191 Hemiplegia, unspecified affecting right dominant side: Secondary | ICD-10-CM | POA: Diagnosis present

## 2016-02-01 DIAGNOSIS — I6789 Other cerebrovascular disease: Secondary | ICD-10-CM | POA: Diagnosis not present

## 2016-02-01 DIAGNOSIS — Z7984 Long term (current) use of oral hypoglycemic drugs: Secondary | ICD-10-CM

## 2016-02-01 DIAGNOSIS — J9600 Acute respiratory failure, unspecified whether with hypoxia or hypercapnia: Secondary | ICD-10-CM | POA: Diagnosis not present

## 2016-02-01 DIAGNOSIS — Z79899 Other long term (current) drug therapy: Secondary | ICD-10-CM

## 2016-02-01 DIAGNOSIS — G40909 Epilepsy, unspecified, not intractable, without status epilepticus: Secondary | ICD-10-CM | POA: Diagnosis present

## 2016-02-01 DIAGNOSIS — I5022 Chronic systolic (congestive) heart failure: Secondary | ICD-10-CM

## 2016-02-01 DIAGNOSIS — Z8673 Personal history of transient ischemic attack (TIA), and cerebral infarction without residual deficits: Secondary | ICD-10-CM

## 2016-02-01 LAB — CBC
HEMATOCRIT: 44.4 % (ref 39.0–52.0)
Hemoglobin: 14.9 g/dL (ref 13.0–17.0)
MCH: 24.8 pg — AB (ref 26.0–34.0)
MCHC: 33.6 g/dL (ref 30.0–36.0)
MCV: 73.8 fL — AB (ref 78.0–100.0)
PLATELETS: 149 10*3/uL — AB (ref 150–400)
RBC: 6.02 MIL/uL — ABNORMAL HIGH (ref 4.22–5.81)
RDW: 15.6 % — AB (ref 11.5–15.5)
WBC: 9.1 10*3/uL (ref 4.0–10.5)

## 2016-02-01 LAB — URINALYSIS, ROUTINE W REFLEX MICROSCOPIC
Bacteria, UA: NONE SEEN
Bilirubin Urine: NEGATIVE
GLUCOSE, UA: 150 mg/dL — AB
KETONES UR: NEGATIVE mg/dL
LEUKOCYTES UA: NEGATIVE
NITRITE: NEGATIVE
PH: 5 (ref 5.0–8.0)
PROTEIN: 30 mg/dL — AB
Specific Gravity, Urine: 1.033 — ABNORMAL HIGH (ref 1.005–1.030)

## 2016-02-01 LAB — BASIC METABOLIC PANEL
Anion gap: 14 (ref 5–15)
BUN: 12 mg/dL (ref 6–20)
CALCIUM: 8.8 mg/dL — AB (ref 8.9–10.3)
CO2: 19 mmol/L — ABNORMAL LOW (ref 22–32)
CREATININE: 1.24 mg/dL (ref 0.61–1.24)
Chloride: 105 mmol/L (ref 101–111)
GFR calc non Af Amer: 58 mL/min — ABNORMAL LOW (ref >60)
Glucose, Bld: 254 mg/dL — ABNORMAL HIGH (ref 65–99)
Potassium: 4 mmol/L (ref 3.5–5.1)
SODIUM: 138 mmol/L (ref 135–145)

## 2016-02-01 LAB — HEPATIC FUNCTION PANEL
ALT: 34 U/L (ref 17–63)
AST: 53 U/L — ABNORMAL HIGH (ref 15–41)
Albumin: 3.3 g/dL — ABNORMAL LOW (ref 3.5–5.0)
Alkaline Phosphatase: 83 U/L (ref 38–126)
BILIRUBIN DIRECT: 0.4 mg/dL (ref 0.1–0.5)
BILIRUBIN INDIRECT: 0.5 mg/dL (ref 0.3–0.9)
TOTAL PROTEIN: 7.3 g/dL (ref 6.5–8.1)
Total Bilirubin: 0.9 mg/dL (ref 0.3–1.2)

## 2016-02-01 LAB — CBC WITH DIFFERENTIAL/PLATELET
BASOS PCT: 0 %
Basophils Absolute: 0 10*3/uL (ref 0.0–0.1)
Eosinophils Absolute: 0.1 10*3/uL (ref 0.0–0.7)
Eosinophils Relative: 1 %
HCT: 47.3 % (ref 39.0–52.0)
Hemoglobin: 15.8 g/dL (ref 13.0–17.0)
LYMPHS ABS: 6.4 10*3/uL — AB (ref 0.7–4.0)
Lymphocytes Relative: 53 %
MCH: 24.9 pg — AB (ref 26.0–34.0)
MCHC: 33.4 g/dL (ref 30.0–36.0)
MCV: 74.5 fL — ABNORMAL LOW (ref 78.0–100.0)
MONO ABS: 0.6 10*3/uL (ref 0.1–1.0)
Monocytes Relative: 5 %
NEUTROS PCT: 41 %
Neutro Abs: 5 10*3/uL (ref 1.7–7.7)
PLATELETS: 163 10*3/uL (ref 150–400)
RBC: 6.35 MIL/uL — AB (ref 4.22–5.81)
RDW: 15.8 % — AB (ref 11.5–15.5)
WBC: 12.1 10*3/uL — ABNORMAL HIGH (ref 4.0–10.5)

## 2016-02-01 LAB — MRSA PCR SCREENING: MRSA BY PCR: NEGATIVE

## 2016-02-01 LAB — I-STAT ARTERIAL BLOOD GAS, ED
Acid-base deficit: 5 mmol/L — ABNORMAL HIGH (ref 0.0–2.0)
Bicarbonate: 23.2 mmol/L (ref 20.0–28.0)
O2 Saturation: 100 %
PCO2 ART: 54.4 mmHg — AB (ref 32.0–48.0)
PO2 ART: 260 mmHg — AB (ref 83.0–108.0)
TCO2: 25 mmol/L (ref 0–100)
pH, Arterial: 7.239 — ABNORMAL LOW (ref 7.350–7.450)

## 2016-02-01 LAB — LIPID PANEL
CHOLESTEROL: 162 mg/dL (ref 0–200)
HDL: 38 mg/dL — AB (ref >40)
LDL CALC: 101 mg/dL — AB (ref 0–99)
TRIGLYCERIDES: 117 mg/dL (ref <150)
Total CHOL/HDL Ratio: 4.3 RATIO
VLDL: 23 mg/dL (ref 0–40)

## 2016-02-01 LAB — I-STAT TROPONIN, ED: Troponin i, poc: 0.47 ng/mL (ref 0.00–0.08)

## 2016-02-01 LAB — TROPONIN I: TROPONIN I: 0.3 ng/mL — AB (ref <0.03)

## 2016-02-01 LAB — ECHOCARDIOGRAM COMPLETE
HEIGHTINCHES: 74 in
WEIGHTICAEL: 3425.07 [oz_av]

## 2016-02-01 LAB — TSH: TSH: 0.337 u[IU]/mL — ABNORMAL LOW (ref 0.350–4.500)

## 2016-02-01 LAB — RAPID URINE DRUG SCREEN, HOSP PERFORMED
AMPHETAMINES: NOT DETECTED
BARBITURATES: NOT DETECTED
BENZODIAZEPINES: NOT DETECTED
COCAINE: POSITIVE — AB
Opiates: NOT DETECTED
TETRAHYDROCANNABINOL: NOT DETECTED

## 2016-02-01 LAB — TRIGLYCERIDES: TRIGLYCERIDES: 119 mg/dL (ref <150)

## 2016-02-01 LAB — GLUCOSE, CAPILLARY
GLUCOSE-CAPILLARY: 197 mg/dL — AB (ref 65–99)
GLUCOSE-CAPILLARY: 130 mg/dL — AB (ref 65–99)
GLUCOSE-CAPILLARY: 135 mg/dL — AB (ref 65–99)
GLUCOSE-CAPILLARY: 146 mg/dL — AB (ref 65–99)

## 2016-02-01 LAB — CREATININE, SERUM
Creatinine, Ser: 1.18 mg/dL (ref 0.61–1.24)
GFR calc Af Amer: >60 mL/min (ref >60)
GFR calc non Af Amer: >60 mL/min (ref >60)

## 2016-02-01 LAB — LACTIC ACID, PLASMA
Lactic Acid, Venous: 2.2 mmol/L (ref 0.5–1.9)
Lactic Acid, Venous: 1.7 mmol/L (ref 0.5–1.9)

## 2016-02-01 LAB — FOLATE: FOLATE: 9.8 ng/mL (ref >5.9)

## 2016-02-01 LAB — BRAIN NATRIURETIC PEPTIDE: B Natriuretic Peptide: 764 pg/mL — ABNORMAL HIGH (ref 0.0–100.0)

## 2016-02-01 LAB — VITAMIN B12: Vitamin B-12: 641 pg/mL (ref 180–914)

## 2016-02-01 MED ORDER — ORAL CARE MOUTH RINSE: 15.0000 mL | Freq: Four times a day (QID) | OROMUCOSAL | Status: DC

## 2016-02-01 MED ORDER — SODIUM CHLORIDE 0.9 % IV SOLN
250.0000 mL | INTRAVENOUS | Status: DC | PRN
Start: 2016-02-01 — End: 2016-02-03

## 2016-02-01 MED ORDER — PROPOFOL 1000 MG/100ML IV EMUL
INTRAVENOUS | Status: AC
  Filled 2016-02-01: qty 100

## 2016-02-01 MED ORDER — ALBUTEROL SULFATE (2.5 MG/3ML) 0.083% IN NEBU: 2.5000 mg | INHALATION_SOLUTION | RESPIRATORY_TRACT | Status: DC | PRN

## 2016-02-01 MED ORDER — ORAL CARE MOUTH RINSE
15.0000 mL | OROMUCOSAL | Status: DC
  Administered 2016-02-01 – 2016-02-03 (×22): 15 mL via OROMUCOSAL

## 2016-02-01 MED ORDER — ASPIRIN 81 MG PO CHEW
324.0000 mg | CHEWABLE_TABLET | ORAL | Status: AC
  Administered 2016-02-01: 324 mg via ORAL
  Filled 2016-02-01: qty 4

## 2016-02-01 MED ORDER — HEPARIN SODIUM (PORCINE) 5000 UNIT/ML IJ SOLN
5000.0000 [IU] | Freq: Three times a day (TID) | INTRAMUSCULAR | Status: DC
  Administered 2016-02-01 – 2016-02-03 (×8): 5000 [IU] via SUBCUTANEOUS
  Filled 2016-02-01 (×8): qty 1

## 2016-02-01 MED ORDER — SODIUM CHLORIDE 0.9 % IV SOLN
1000.0000 mg | INTRAVENOUS | Status: AC
  Administered 2016-02-01: 1000 mg via INTRAVENOUS
  Filled 2016-02-01: qty 10

## 2016-02-01 MED ORDER — LABETALOL HCL 5 MG/ML IV SOLN
20.0000 mg | INTRAVENOUS | Status: DC | PRN
  Administered 2016-02-02: 20 mg via INTRAVENOUS
  Filled 2016-02-01: qty 4

## 2016-02-01 MED ORDER — ETOMIDATE 2 MG/ML IV SOLN
INTRAVENOUS | Status: AC | PRN
  Administered 2016-02-01: 20 mg via INTRAVENOUS

## 2016-02-01 MED ORDER — ATORVASTATIN CALCIUM 40 MG PO TABS
40.0000 mg | ORAL_TABLET | Freq: Every day | ORAL | Status: DC
  Administered 2016-02-01 – 2016-02-02 (×2): 40 mg via ORAL
  Filled 2016-02-01 (×2): qty 1

## 2016-02-01 MED ORDER — FENTANYL CITRATE (PF) 100 MCG/2ML IJ SOLN: 50.0000 ug | INTRAMUSCULAR | Status: DC | PRN

## 2016-02-01 MED ORDER — IOPAMIDOL (ISOVUE-370) INJECTION 76%
INTRAVENOUS | Status: AC
  Administered 2016-02-01: 50 mL
  Filled 2016-02-01: qty 50

## 2016-02-01 MED ORDER — CEFTRIAXONE SODIUM 1 G IJ SOLR
1.0000 g | Freq: Once | INTRAMUSCULAR | Status: AC
  Administered 2016-02-01: 1 g via INTRAVENOUS
  Filled 2016-02-01: qty 10

## 2016-02-01 MED ORDER — PROPOFOL 1000 MG/100ML IV EMUL
5.0000 ug/kg/min | Freq: Once | INTRAVENOUS | Status: AC
  Administered 2016-02-01: 10 ug/kg/min via INTRAVENOUS

## 2016-02-01 MED ORDER — CHLORHEXIDINE GLUCONATE 0.12% ORAL RINSE (MEDLINE KIT)
15.0000 mL | Freq: Two times a day (BID) | OROMUCOSAL | Status: DC
  Administered 2016-02-01 – 2016-02-03 (×4): 15 mL via OROMUCOSAL

## 2016-02-01 MED ORDER — ASPIRIN 300 MG RE SUPP: 300.0000 mg | Freq: Every day | RECTAL | Status: DC

## 2016-02-01 MED ORDER — FAMOTIDINE IN NACL 20-0.9 MG/50ML-% IV SOLN
20.0000 mg | Freq: Two times a day (BID) | INTRAVENOUS | Status: DC
  Administered 2016-02-01 – 2016-02-03 (×5): 20 mg via INTRAVENOUS
  Filled 2016-02-01 (×5): qty 50

## 2016-02-01 MED ORDER — SUCCINYLCHOLINE CHLORIDE 20 MG/ML IJ SOLN
INTRAMUSCULAR | Status: AC | PRN
Start: 2016-02-01 — End: 2016-02-01
  Administered 2016-02-01: 150 mg via INTRAVENOUS

## 2016-02-01 MED ORDER — FUROSEMIDE 10 MG/ML IJ SOLN
40.0000 mg | Freq: Once | INTRAMUSCULAR | Status: AC
  Administered 2016-02-01: 40 mg via INTRAVENOUS
  Filled 2016-02-01: qty 4

## 2016-02-01 MED ORDER — SODIUM CHLORIDE 0.9 % IV SOLN
3.0000 g | Freq: Four times a day (QID) | INTRAVENOUS | Status: DC
  Administered 2016-02-01 – 2016-02-03 (×9): 3 g via INTRAVENOUS
  Filled 2016-02-01 (×12): qty 3

## 2016-02-01 MED ORDER — IOPAMIDOL (ISOVUE-370) INJECTION 76%
INTRAVENOUS | Status: AC
  Administered 2016-02-01: 50 mL
  Filled 2016-02-01: qty 100

## 2016-02-01 MED ORDER — CHLORHEXIDINE GLUCONATE 0.12% ORAL RINSE (MEDLINE KIT)
15.0000 mL | Freq: Two times a day (BID) | OROMUCOSAL | Status: DC
  Administered 2016-02-01: 15 mL via OROMUCOSAL

## 2016-02-01 MED ORDER — INSULIN ASPART 100 UNIT/ML ~~LOC~~ SOLN
2.0000 [IU] | SUBCUTANEOUS | Status: DC
  Administered 2016-02-01: 4 [IU] via SUBCUTANEOUS
  Administered 2016-02-01 (×2): 2 [IU] via SUBCUTANEOUS
  Administered 2016-02-02: 6 [IU] via SUBCUTANEOUS
  Administered 2016-02-02: 4 [IU] via SUBCUTANEOUS
  Administered 2016-02-02 (×4): 2 [IU] via SUBCUTANEOUS
  Administered 2016-02-03: 6 [IU] via SUBCUTANEOUS
  Administered 2016-02-03 (×2): 4 [IU] via SUBCUTANEOUS

## 2016-02-01 MED ORDER — SODIUM CHLORIDE 0.9 % IV SOLN
750.0000 mg | Freq: Two times a day (BID) | INTRAVENOUS | Status: DC
  Administered 2016-02-01 – 2016-02-02 (×2): 750 mg via INTRAVENOUS
  Filled 2016-02-01 (×3): qty 7.5

## 2016-02-01 MED ORDER — PROPOFOL 1000 MG/100ML IV EMUL
0.0000 ug/kg/min | INTRAVENOUS | Status: DC
  Administered 2016-02-01: 25 ug/kg/min via INTRAVENOUS
  Administered 2016-02-01: 30 ug/kg/min via INTRAVENOUS
  Administered 2016-02-01 – 2016-02-02 (×5): 25 ug/kg/min via INTRAVENOUS
  Filled 2016-02-01 (×7): qty 100

## 2016-02-01 MED ORDER — ASPIRIN 300 MG RE SUPP: 300.0000 mg | RECTAL | Status: AC

## 2016-02-01 MED ORDER — ASPIRIN EC 325 MG PO TBEC
325.0000 mg | DELAYED_RELEASE_TABLET | Freq: Every day | ORAL | Status: DC
  Administered 2016-02-02 – 2016-02-03 (×2): 325 mg via ORAL
  Filled 2016-02-01 (×2): qty 1

## 2016-02-01 MED ORDER — DEXTROSE 5 % IV SOLN
500.0000 mg | Freq: Once | INTRAVENOUS | Status: AC
  Administered 2016-02-01: 500 mg via INTRAVENOUS
  Filled 2016-02-01: qty 500

## 2016-02-01 NOTE — H&P (Signed)
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 On arrival, EMS noted diffuse wheezing and marked hypertension (BP 290/150 per triage note). Sats on RA were 70s. He was given albuterol and transported. EMS was unable to establish an airway in the field. He was intubated on arrival to the ED. GCS reportedly 3 throughout. During intubation he reportedly moved his left arm and had leftward gaze deviation. No other movement noted. CT head without any acute findings. He has been seen by neurology - they feel seizure with post-ictal state is most likely and have loaded him with Keppra, but ischemic stroke has not been excluded (awaiting final CTA read).   Labs are notable for mild leukocytosis at 12.1, normal diff. BP remains high at 201/141 despite propofol. Mixed metabolic and respiratory acidosis with pH 7.239, pCO2 54.4, HCO3 19. Cr at baseline ~1.2. BNP elevated @ 764, Troponin 0.47. ECG with mild ST changes (depression II, III, AVF, V4-6, mild elevation anterior leads - v1,v2,v3), difficult to interpret in setting of known LVH and marked hypertension. CXR with bilateral infiltrates suggestive of pulmonary edema, enlarged cardiac silhouette, ETT in adequate position.  His wife reports that he has had significant chest congestion for the past week or so. [facility] and CX[facility] without any obvious abnormality, but was given a Z-Pak, guaifenecin, and tessalon perles. He continued to  have significant mucus production, but it was mostly clear or whitish. No blood. No associated fever or chills. No other infectious symptoms. He had orthopnea with shortness of breath and cough if he laid back flat. He was sleeping propped up on the cough. He did apparently use "quite a lot" of cocaine 2 days ago, but no associated chest pain, palpitations or other symptoms. He started a new medication for Hepatitis C on 1/10, but had not endorsed any side effects. His blood pressure has been running high, with several readings > 200 systolic. His wife says he has been pretty good about taking his medications, though. Last witnessed seizure was about 1 week ago. Deficits related to prior strokes were all left sided. His right side has been strong.  He would want to be FULL CODE.    PAST MEDICAL HISTORY :  He  has a past medical history of Cardiomyopathy; Cocaine abuse; Hepatitis C; Hypertension; Multiple lacunar infarcts (HCC); Pre-diabetes; Seizures (HCC); and Tobacco abuse.  PAST SURGICAL HISTORY: He  has a past surgical history that includes Back surgery.  Allergies  Allergen Reactions  . Morphine And Related     Hiccups    No current facility-administered medications on file prior to encounter.    Current Outpatient Prescriptions on File Prior to Encounter  Medication Sig  . albuterol (PROVENTIL HFA;VENTOLIN HFA) 108 (90 BASE) MCG/ACT inhaler Inhale 2 puffs into the lungs every 6 (six) hours as needed for shortness of breath.   Marland Kitchen. amLODipine (NORVASC) 5 MG tablet Take 1 tablet (5 mg total) by mouth daily.  Marland Kitchen. amoxicillin (  AMOXIL) 500 MG capsule Take 1 capsule (500 mg total) by mouth 3 (three) times daily. (Patient not taking: Reported on 12/20/2014)  . aspirin 325 MG tablet Take 1 tablet (325 mg total) by mouth daily.  Marland Kitchen atorvastatin (LIPITOR) 40 MG tablet Take 1 tablet (40 mg total) by mouth daily at 6 PM.  . gabapentin (NEURONTIN) 600 MG tablet Take 600 mg by mouth 4 (four) times daily.     Marland Kitchen glipiZIDE (GLUCOTROL) 2.5 mg TABS tablet Take 0.5 tablets (2.5 mg total) by mouth daily before breakfast.  . HYDROcodone-acetaminophen (NORCO) 5-325 MG per tablet Take 1 tablet by mouth every 6 (six) hours as needed for moderate pain. (Patient not taking: Reported on 12/20/2014)  . lisinopril (PRINIVIL,ZESTRIL) 40 MG tablet Take 1 tablet (40 mg total) by mouth daily.  . mirtazapine (REMERON) 15 MG tablet Take 15 mg by mouth at bedtime.  . naproxen (NAPROSYN) 500 MG tablet Take 500 mg by mouth 2 (two) times daily as needed for mild pain (for pain).   . ranitidine (ZANTAC) 150 MG tablet Take 150 mg by mouth 2 (two) times daily.    . valACYclovir (VALTREX) 500 MG tablet Take 500 mg by mouth 2 (two) times daily.    FAMILY HISTORY:  His has no family status information on file.    SOCIAL HISTORY: He  reports that he has been smoking Cigarettes.  He has been smoking about 0.25 packs per day. He has never used smokeless tobacco. He reports that he drinks alcohol. He reports that he uses drugs, including Marijuana.  REVIEW OF SYSTEMS:   Unable to obtain 2/2 intubated state  SUBJECTIVE:    VITAL SIGNS: BP (!) 201/141   Temp 98.9 F (37.2 C) (Rectal)   Ht 6\' 2"  (1.88 m)   Wt 97.1 kg (214 lb 1.1 oz) Comment: from March 2017 records, needs to be updated  SpO2 97%   BMI 27.48 kg/m   HEMODYNAMICS:    VENTILATOR SETTINGS: Vent Mode: PRVC FiO2 (%):  [50 %-100 %] 50 % Set Rate:  [16 bmp] 16 bmp Vt Set:  [540 mL-650 mL] 650 mL PEEP:  [5 cmH20] 5 cmH20 Plateau Pressure:  [23 cmH20] 23 cmH20  INTAKE / OUTPUT: No intake/output data recorded.  PHYSICAL EXAMINATION: Physical Exam: Temp:  [98.9 F (37.2 C)] 98.9 F (37.2 C) (01/14 0252) BP: (201)/(141) 201/141 (01/14 0245) SpO2:  [97 %] 97 % (01/14 0245) FiO2 (%):  [50 %-100 %] 50 % (01/14 0400) Weight:  [97.1 kg (214 lb 1.1 oz)] 97.1 kg (214 lb 1.1 oz) (01/14 0255)  General Well nourished, well developed, intubated, sedated   HEENT No gross abnormalities. OETT, OGT in place  Pulmonary Clear to auscultation bilaterally with no wheezes, rales or ronchi. Good effort, symmetrical expansion.   Cardiovascular Normal rate, regular rhythm. S1, s2. No m/r/g. Distal pulses palpable.  Abdomen Soft, non-tender, non-distended, positive bowel sounds, no palpable organomegaly or masses. Normoresonant to percussion.  Musculoskeletal Grossly normal. Post-op changes from L TKA.   Lymphatics No cervical, supraclavicular or axillary adenopathy.   Neurologic Limited by sedation. Pupils equal, reactive. Gaze midline. Will not track. Moves LUE and bilateral legs spontaneously. No movement RUE. No commands. No eye opening.   Skin/Integuement No rash, no cyanosis, no clubbing.     LABS:  BMET  Recent Labs Lab March 02, 2016 0250  NA 138  K 4.0  CL 105  CO2 19*  BUN 12  CREATININE 1.24  GLUCOSE 254*    Electrolytes  Recent Labs Lab 02-05-16 0250  CALCIUM 8.8*    CBC  Recent Labs Lab 02-05-16 0250  WBC 12.1*  HGB 15.8  HCT 47.3  PLT 163    Coag's No results for input(s): APTT, INR in the last 168 hours.  Sepsis Markers No results for input(s): LATICACIDVEN, PROCALCITON, O2SATVEN in the last 168 hours.  ABG  Recent Labs Lab February 05, 2016 0338  PHART 7.239*  PCO2ART 54.4*  PO2ART 260.0*    Liver Enzymes  Recent Labs Lab 05-Feb-2016 0250  AST 53*  ALT 34  ALKPHOS 83  BILITOT 0.9  ALBUMIN 3.3*    Cardiac Enzymes  Recent Labs Lab February 05, 2016 0250  TROPONINI 0.30*    Glucose No results for input(s): GLUCAP in the last 168 hours.  Imaging Ct Angio Head W Or Wo Contrast  Result Date: Feb 05, 2016 CLINICAL DATA:  STROKE EXAM: CT ANGIOGRAPHY BRAIN CT PERFUSION BRAIN TECHNIQUE: Angiographic CT images of the brain were acquired following the administration of IV contrast. Multiphase CT imaging of the brain was performed following IV bolus contrast injection. Subsequent parametric perfusion maps were  calculated using RAPID software. CONTRAST:  40 mL Isovue 370 IV COMPARISON:  None. FINDINGS: The examination is degraded by poor contrast opacification. 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 There is atherosclerotic calcification of both proximal intracranial internal carotid arteries. The right MCA is patent. The anterior cerebral arteries are patent proximally, but difficult to visualize distally. The right posterior cerebral artery is normal. Visualization of the left posterior cerebral artery is poor, but appears patent. The basilar artery is normal. There is extensive hypoattenuation throughout the left MCA territory. CT Brain Perfusion Findings: The perfusion portion of the examination is nondiagnostic. The field of view was set incorrectly. IMPRESSION: 1. 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 2. CT findings of acute left MCA territory infarct. 3. Markedly limited examination due to poor contrast bolus and inaccurate field of view on the perfusion portion of the study. Non-diagnostic perfusion scan. Critical Value/emergent results were called by telephone at the time of interpretation on Feb 05, 2016 at 5:49 am to Dr. Geoffery Lyons, who verbally acknowledged these results. Electronically Signed   By: Deatra Robinson M.D.   On: February 05, 2016 06:00   Ct Head Wo Contrast  Result Date: 02/05/2016 CLINICAL DATA:  Acute onset of altered level of consciousness. Initial encounter. EXAM: CT HEAD WITHOUT CONTRAST TECHNIQUE: Contiguous axial images were obtained from the base of the skull through the vertex without intravenous contrast. COMPARISON:  CT of the head performed 04/03/2015 FINDINGS: Brain: No evidence of acute infarction, hemorrhage, hydrocephalus, extra-axial collection or mass lesion/mass effect. Prominence of the ventricles and sulci reflects mild cortical volume loss. Mild cerebellar atrophy is noted. Scattered  periventricular and subcortical white matter change likely reflects small vessel ischemic microangiopathy. Chronic lacunar infarcts are noted at the corona radiata bilaterally, and at the basal ganglia. The brainstem and fourth ventricle are within normal limits. The cerebral hemispheres demonstrate grossly normal gray-white differentiation. No mass effect or midline shift is seen. Vascular: No hyperdense vessel or unexpected calcification. Skull: There is no evidence of fracture; visualized osseous structures are unremarkable in appearance. Sinuses/Orbits: The orbits are within normal limits. Mucosal thickening is noted at the right maxillary sinus. The remaining paranasal sinuses and mastoid air cells are well-aerated. Other: No significant soft tissue abnormalities are seen. IMPRESSION: 1. No acute intracranial pathology seen on CT. 2. Mild cortical volume  loss and scattered small vessel ischemic microangiopathy. 3. Chronic lacunar infarct at the corona radiata bilaterally, and at the basal ganglia. 4. Mucosal thickening at the right maxillary sinus. 5. Electronically Signed   By: Roanna Raider M.D.   On: 01/28/2016 03:29   Ct Cerebral Perfusion W Contrast  Result Date: 02/02/2016 CLINICAL DATA:  STROKE EXAM: CT ANGIOGRAPHY BRAIN CT PERFUSION BRAIN TECHNIQUE: Angiographic CT images of the brain were acquired following the administration of IV contrast. Multiphase CT imaging of the brain was performed following IV bolus contrast injection. Subsequent parametric perfusion maps were calculated using RAPID software. CONTRAST:  40 mL Isovue 370 IV COMPARISON:  None. FINDINGS: The examination is degraded by poor contrast opacification. 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 There is atherosclerotic calcification of both proximal intracranial internal carotid arteries. The right MCA is patent. The anterior cerebral arteries are patent  proximally, but difficult to visualize distally. The right posterior cerebral artery is normal. Visualization of the left posterior cerebral artery is poor, but appears patent. The basilar artery is normal. There is extensive hypoattenuation throughout the left MCA territory. CT Brain Perfusion Findings: The perfusion portion of the examination is nondiagnostic. The field of view was set incorrectly. IMPRESSION: 1. 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 2. CT findings of acute left MCA territory infarct. 3. Markedly limited examination due to poor contrast bolus and inaccurate field of view on the perfusion portion of the study. Non-diagnostic perfusion scan. Critical Value/emergent results were called by telephone at the time of interpretation on 02/17/2016 at 5:49 am to Dr. Geoffery Lyons, who verbally acknowledged these results. Electronically Signed   By: Deatra Robinson M.D.   On: 01/20/2016 06:00   Dg Chest Port 1 View  Result Date: 01/30/2016 CLINICAL DATA:  Endotracheal tube and orogastric tube placement. Initial encounter. EXAM: PORTABLE CHEST 1 VIEW COMPARISON:  Chest radiograph performed 06/06/2014 FINDINGS: The patient's endotracheal tube is seen ending 3 cm above the carina. An enteric tube is noted ending about the body of the stomach. Vascular congestion is noted. Hazy bilateral airspace opacification may reflect bilateral pneumonia or pulmonary edema. No pleural effusion or pneumothorax is seen. The cardiomediastinal silhouette is mildly enlarged. No acute osseous abnormalities are seen. IMPRESSION: 1. Endotracheal tube seen ending 3 cm above the carina. 2. Enteric tube noted ending about the body of the stomach. 3. Vascular congestion and mild cardiomegaly. Hazy bilateral airspace opacification may reflect bilateral pneumonia or pulmonary edema. Electronically Signed   By: Roanna Raider M.D.   On: 02/17/2016 03:16     STUDIES:     CULTURES: none  ANTIBIOTICS: none  SIGNIFICANT EVENTS:   LINES/TUBES: OETT 1/14 >> OGT 1/14 >>  DISCUSSION: Matthew Thornton is a 57M presenting with altered mental status, hypertensive emergency, probable seizure (known history) and mild troponin leak with non-specific ECG changes. CTA head has just resulted and does suggest acute L MCA occlusion, though it was a poor quality study.   ASSESSMENT / PLAN:  PULMONARY A: Acute hypoxemic respiratory failure - likely 2/2 flash pulmonary edema P:   Ventilator support Wean as tolerated SBT when able  CARDIOVASCULAR A:  Hypertensive emergency Mild troponin leak Cocaine + P:  Trend troponins PRN labetalol for SBP > 180 TTE  NEUROLOGIC A:   Acute L MCA territory stroke - no TPA given 2/2 outside therapeutic window P:   RASS goal: 0 to -1 Permissive HTN  PRN labetalol for SBP > 180 Supportive care cEEG per neuro Continue Keppra Neuro following  RENAL A:   Mixed metabolic and respiratory acidosis P:   Correct respiratory component Follow BMP Check lactate  GASTROINTESTINAL A:   No acute issues P:   NPO for now Stress ulcer ppx w/ famotidine  HEMATOLOGIC A:   Mild leukocytosis P:  Trend CBC  INFECTIOUS A:   Leukocytosis - likely reactive - with no reported infectious symptoms P:   Trend CBC No antibiotics for now  ENDOCRINE A:   Borderline diabtetes P:   CBGs, SSI as needed   FAMILY  - Updates: Long discussion with wife and sister. Patient would want to be FULL CODE.  - Inter-disciplinary family meet or Palliative Care meeting due by:  day 7  The patient is critically ill with multiple organ system failure and requires high complexity decision making for assessment and support, frequent evaluation and titration of therapies, advanced monitoring, review of radiographic studies and interpretation of complex data.   Critical Care Time devoted to patient care services, exclusive of separately  billable procedures, described in this note is 74 minutes.   Nita Sickle, MD Pulmonary and Critical Care Medicine [facility] Pager: [phone]  02/07/2016, 5:45 AM

## 2016-02-01 NOTE — Progress Notes (Signed)
Pt transported from ED to 3M11 without incident.

## 2016-02-01 NOTE — ED Provider Notes (Signed)
bedtime.    Historical Provider, MD  naproxen (NAPROSYN) 500 MG tablet Take 500 mg by mouth 2 (two) times daily as needed for mild pain (for pain).     Historical Provider, MD  ranitidine (ZANTAC) 150 MG tablet Take 150 mg by mouth 2 (two) times daily.      Historical Provider, MD  valACYclovir (VALTREX) 500 MG tablet Take 500 mg by mouth 2 (two) times daily.    Historical Provider, MD    Family History No family history on file.  Social History Social History  Substance Use Topics  . Smoking status: Current Every Day Smoker    Packs/day: 0.25    Types: Cigarettes  . Smokeless tobacco: Never Used     Comment: 4 cigs/day.  . Alcohol use Yes      Comment: Whiskey sometimes.     Allergies   Morphine and related   Review of Systems Review of Systems  Unable to perform ROS: Acuity of condition     Physical Exam Updated Vital Signs BP (!) 201/141   Temp 98.9 F (37.2 C) (Rectal)   Ht 6\' 2"  (1.88 m)   Wt 214 lb 1.1 oz (97.1 kg) Comment: from March 2017 records, needs to be updated  SpO2 97%   BMI 27.48 kg/m   Physical Exam  Constitutional: He appears well-developed and well-nourished.  Pt is an elderly male. Appears acutely ill. He is unresponsive with snoring respirations.   HENT:  Head: Normocephalic and atraumatic.  Eyes:  There is leftward gaze deviation. Pupils 3mm and reactive.   Neck: Normal range of motion.  Cardiovascular: Normal rate, regular rhythm, normal heart sounds and intact distal pulses.   Pulmonary/Chest: He is in respiratory distress.  Breath sounds are coarse bilaterally. He is in moderate to severe respiratory distress.   Abdominal: Soft. He exhibits no distension. There is no tenderness.  Musculoskeletal: Normal range of motion.  Neurological:  GCS is currently 6. He was seen moving his left arm. Remainder of exam impossible sec to acuity of condition.   Skin: Skin is warm and dry.  Nursing note and vitals reviewed.    ED Treatments / Results  DIAGNOSTIC STUDIES:  Oxygen Saturation is 97% on ETT, normal by my interpretation.    Labs (all labs ordered are listed, but only abnormal results are displayed) Labs Reviewed  CBC WITH DIFFERENTIAL/PLATELET - Abnormal; Notable for the following:       Result Value   WBC 12.1 (*)    RBC 6.35 (*)    MCV 74.5 (*)    MCH 24.9 (*)    RDW 15.8 (*)    All other components within normal limits  I-STAT TROPOININ, ED - Abnormal; Notable for the following:    Troponin i, poc 0.47 (*)    All other components within normal limits  BASIC METABOLIC PANEL  TROPONIN I  BRAIN NATRIURETIC PEPTIDE    EKG  EKG Interpretation  Date/Time:  Sunday  02/11/16 02:52:02 EST Ventricular Rate:  93 PR Interval:    QRS Duration: 117 QT Interval:  354 QTC Calculation: 441 R Axis:   71 Text Interpretation:  Sinus rhythm Left atrial enlargement LVH with IVCD and secondary repol abnrm ST depr, consider ischemia, inferior leads Anterior ST elevation, probably due to LVH Confirmed by Iverson Sees  MD, Matthewjames Petrasek ([id number]) on 2016/02/11 5:58:55 AM       Radiology No results found.  Procedures Procedures (including critical care time)  EMERGENT INTUBATION PROCEDURE NOTE INDICATION:  airway compromise  TECHNIQUE: Unable to obtain consent because of emergent medical necessity.  After pre-oxygenating the patient for 3 minutes, a modified rapid-sequence induction was performed using etomidate and succinylcholine with cricoid pressure. Using a Macintosh 3 laryngoscope blade and 7.34mm cuffed endotracheal tube was placed and secured.  Placement was confirmed with by auscultation and by CXR.  COMPLICATIONS: None. The patient tolerated the procedure well with no complications. POST PROCEDURE CXR: pending   CRITICAL CARE Performed by: Geoffery Lyons, MD  Total critical care time: 60 minutes  Critical care time was exclusive of separately billable procedures and treating other patients.  Critical care was necessary to treat or prevent imminent or life-threatening deterioration.  Critical care was time spent personally by me on the following activities: development of treatment plan with patient and/or surrogate as well as nursing, discussions with consultants, evaluation of patient's response to treatment, examination of patient, obtaining history from patient or surrogate, ordering and performing treatments and interventions, ordering and review of laboratory studies, ordering and review of radiographic studies, pulse oximetry and re-evaluation of patient's condition.   Medications Ordered in ED Medications  propofol (DIPRIVAN) 1000 MG/100ML infusion (not  administered)  etomidate (AMIDATE) injection (20 mg Intravenous Given Feb 27, 2016 0243)  succinylcholine (ANECTINE) injection (150 mg Intravenous Given 02-27-2016 0243)     Initial Impression / Assessment and Plan / ED Course  I have reviewed the triage vital signs and the nursing notes.  Pertinent labs & imaging results that were available during my care of the patient were reviewed by me and considered in my medical decision making (see chart for details).  Clinical Course     Patient is a 66 year old male with history of prior CVA and seizures. He was brought by EMS after his wife found him unresponsive in bed. He was making snoring respirations, however did not wake up when she shook him. She then called 911 and he was brought here for evaluation. He was unresponsive with initial GCS of 3. An attempt at intubation was made at the field, however was unsuccessful and he was brought here being ventilated by bag valve mask. In speaking with the wife, she tells me his last time seen normal was approximately midnight when he was sitting up in bed eating a candy cane.  He arrived here unresponsive, not moving, and with eye deviation to the left. He was making snoring respirations and his respirations were assisted by EMS. The patient was intubated as per the RSI note above. Tube placement was confirmed with direct visualization, end tidal CO2, and auscultation over the stomach and lungs.  He then went for an emergent head CT which was unremarkable. He was recently treated for an upper respiratory infection and today had copious amounts of frothy material coming from his endotracheal tube, requiring continual suctioning. His clinical presentation, however was most consistent with an acute neurologic event, possible a seizure or cva. I then consulted neurology. Dr. Otelia Limes recommended a CT angio of the head and perfusion scan, and to call a Code Stroke. These were performed and revealed what appeared to be an  occluded middle cerebral artery. This finding was conveyed to Dr. Otelia Limes who feels as though the patient is now out of the window for intervention.  I've spoken with Dr. Delton See from critical care and the patient will be evaluated and admitted by [facility] M.  3:56 AM Discussed case with Otelia Limes (Neurology).   Final Clinical Impressions(s) / ED Diagnoses   Final diagnoses:  None  airway compromise  TECHNIQUE: Unable to obtain consent because of emergent medical necessity.  After pre-oxygenating the patient for 3 minutes, a modified rapid-sequence induction was performed using etomidate and succinylcholine with cricoid pressure. Using a Macintosh 3 laryngoscope blade and 7.34mm cuffed endotracheal tube was placed and secured.  Placement was confirmed with by auscultation and by CXR.  COMPLICATIONS: None. The patient tolerated the procedure well with no complications. POST PROCEDURE CXR: pending   CRITICAL CARE Performed by: Geoffery Lyons, MD  Total critical care time: 60 minutes  Critical care time was exclusive of separately billable procedures and treating other patients.  Critical care was necessary to treat or prevent imminent or life-threatening deterioration.  Critical care was time spent personally by me on the following activities: development of treatment plan with patient and/or surrogate as well as nursing, discussions with consultants, evaluation of patient's response to treatment, examination of patient, obtaining history from patient or surrogate, ordering and performing treatments and interventions, ordering and review of laboratory studies, ordering and review of radiographic studies, pulse oximetry and re-evaluation of patient's condition.   Medications Ordered in ED Medications  propofol (DIPRIVAN) 1000 MG/100ML infusion (not  administered)  etomidate (AMIDATE) injection (20 mg Intravenous Given Feb 27, 2016 0243)  succinylcholine (ANECTINE) injection (150 mg Intravenous Given 02-27-2016 0243)     Initial Impression / Assessment and Plan / ED Course  I have reviewed the triage vital signs and the nursing notes.  Pertinent labs & imaging results that were available during my care of the patient were reviewed by me and considered in my medical decision making (see chart for details).  Clinical Course     Patient is a 66 year old male with history of prior CVA and seizures. He was brought by EMS after his wife found him unresponsive in bed. He was making snoring respirations, however did not wake up when she shook him. She then called 911 and he was brought here for evaluation. He was unresponsive with initial GCS of 3. An attempt at intubation was made at the field, however was unsuccessful and he was brought here being ventilated by bag valve mask. In speaking with the wife, she tells me his last time seen normal was approximately midnight when he was sitting up in bed eating a candy cane.  He arrived here unresponsive, not moving, and with eye deviation to the left. He was making snoring respirations and his respirations were assisted by EMS. The patient was intubated as per the RSI note above. Tube placement was confirmed with direct visualization, end tidal CO2, and auscultation over the stomach and lungs.  He then went for an emergent head CT which was unremarkable. He was recently treated for an upper respiratory infection and today had copious amounts of frothy material coming from his endotracheal tube, requiring continual suctioning. His clinical presentation, however was most consistent with an acute neurologic event, possible a seizure or cva. I then consulted neurology. Dr. Otelia Limes recommended a CT angio of the head and perfusion scan, and to call a Code Stroke. These were performed and revealed what appeared to be an  occluded middle cerebral artery. This finding was conveyed to Dr. Otelia Limes who feels as though the patient is now out of the window for intervention.  I've spoken with Dr. Delton See from critical care and the patient will be evaluated and admitted by [facility] M.  3:56 AM Discussed case with Otelia Limes (Neurology).   Final Clinical Impressions(s) / ED Diagnoses   Final diagnoses:  None  airway compromise  TECHNIQUE: Unable to obtain consent because of emergent medical necessity.  After pre-oxygenating the patient for 3 minutes, a modified rapid-sequence induction was performed using etomidate and succinylcholine with cricoid pressure. Using a Macintosh 3 laryngoscope blade and 7.34mm cuffed endotracheal tube was placed and secured.  Placement was confirmed with by auscultation and by CXR.  COMPLICATIONS: None. The patient tolerated the procedure well with no complications. POST PROCEDURE CXR: pending   CRITICAL CARE Performed by: Geoffery Lyons, MD  Total critical care time: 60 minutes  Critical care time was exclusive of separately billable procedures and treating other patients.  Critical care was necessary to treat or prevent imminent or life-threatening deterioration.  Critical care was time spent personally by me on the following activities: development of treatment plan with patient and/or surrogate as well as nursing, discussions with consultants, evaluation of patient's response to treatment, examination of patient, obtaining history from patient or surrogate, ordering and performing treatments and interventions, ordering and review of laboratory studies, ordering and review of radiographic studies, pulse oximetry and re-evaluation of patient's condition.   Medications Ordered in ED Medications  propofol (DIPRIVAN) 1000 MG/100ML infusion (not  administered)  etomidate (AMIDATE) injection (20 mg Intravenous Given Feb 27, 2016 0243)  succinylcholine (ANECTINE) injection (150 mg Intravenous Given 02-27-2016 0243)     Initial Impression / Assessment and Plan / ED Course  I have reviewed the triage vital signs and the nursing notes.  Pertinent labs & imaging results that were available during my care of the patient were reviewed by me and considered in my medical decision making (see chart for details).  Clinical Course     Patient is a 66 year old male with history of prior CVA and seizures. He was brought by EMS after his wife found him unresponsive in bed. He was making snoring respirations, however did not wake up when she shook him. She then called 911 and he was brought here for evaluation. He was unresponsive with initial GCS of 3. An attempt at intubation was made at the field, however was unsuccessful and he was brought here being ventilated by bag valve mask. In speaking with the wife, she tells me his last time seen normal was approximately midnight when he was sitting up in bed eating a candy cane.  He arrived here unresponsive, not moving, and with eye deviation to the left. He was making snoring respirations and his respirations were assisted by EMS. The patient was intubated as per the RSI note above. Tube placement was confirmed with direct visualization, end tidal CO2, and auscultation over the stomach and lungs.  He then went for an emergent head CT which was unremarkable. He was recently treated for an upper respiratory infection and today had copious amounts of frothy material coming from his endotracheal tube, requiring continual suctioning. His clinical presentation, however was most consistent with an acute neurologic event, possible a seizure or cva. I then consulted neurology. Dr. Otelia Limes recommended a CT angio of the head and perfusion scan, and to call a Code Stroke. These were performed and revealed what appeared to be an  occluded middle cerebral artery. This finding was conveyed to Dr. Otelia Limes who feels as though the patient is now out of the window for intervention.  I've spoken with Dr. Delton See from critical care and the patient will be evaluated and admitted by [facility] M.  3:56 AM Discussed case with Otelia Limes (Neurology).   Final Clinical Impressions(s) / ED Diagnoses   Final diagnoses:  None

## 2016-02-01 NOTE — Progress Notes (Signed)
EEG completed, results pending. 

## 2016-02-01 NOTE — ED Notes (Signed)
Unable to insert temperature probe foley. Catheter would not pass prostate. Coude placed.

## 2016-02-01 NOTE — ED Notes (Signed)
Propofol started @ 10mcg/kg/min

## 2016-02-01 NOTE — Progress Notes (Signed)
SLP Cancellation Note  Patient Details Name: Matthew Thornton MRN: [id number] DOB: 02/23/1950   Cancelled treatment:       Reason Eval/Treat Not Completed: Patient not medically ready. Patient intubated. SLP will follow up when medically appropriate.  Matthew Thornton, [place] CF-SLP Speech-Language Pathologist [phone]   Matthew Thornton Dec 26, 2016, 10:08 AM

## 2016-02-01 NOTE — Progress Notes (Addendum)
Pharmacy Antibiotic Note  Matthew Thornton is a [age] y.o. male admitted on 01/30/2016 with aspiration pneumonia.  Pharmacy has been consulted for ampicillin/sulbactam dosing.  Plan: - Unasyn 3g IV q6h - Monitor C&S, CBC and clinical progression  Height: 6\' 2"  (188 cm) Weight: 214 lb 1.1 oz (97.1 kg) (from March 2017 records, needs to be updated) IBW/kg (Calculated) : 82.2  Temp (24hrs), Avg:98.9 F (37.2 C), Min:98.9 F (37.2 C), Max:98.9 F (37.2 C)   Recent Labs Lab 02/07/2016 0250 01/21/2016 0756  WBC 12.1* 9.1  CREATININE 1.24 1.18    Estimated Creatinine Clearance: 70.6 mL/min (by C-G formula based on SCr of 1.18 mg/dL).    Allergies  Allergen Reactions  . Morphine And Related     Hiccups    Antimicrobials this admission:  Ceftriaxone 1/14 x1 Azithromycin 1/14 x1 Unasyn 1/14>>  Dose adjustments this admission:   Microbiology results:  1/14 Resp cx: sent  Thank you for allowing pharmacy to be a part of this patient's care.  Casilda Carlsaylor Ansh Fauble, PharmD, BCPS PGY-2 Infectious Diseases Pharmacy Resident Pager: [phone] 01/25/2016 9:29 AM

## 2016-02-01 NOTE — Procedures (Signed)
EEG report  Referring physician- Jindong XU  History- 33 male with history of stroke and seizures came after seizure like event  Hospital Medications  0.9 % sodium chloride infusion   albuterol (PROVENTIL) (2.5 MG/3ML) 0.083% nebulizer solution 2.5 mg   Ampicillin-Sulbactam (UNASYN) 3 g in sodium chloride 0.9 % 100 mL IVPB   aspirin EC tablet 325 mg   atorvastatin (LIPITOR) tablet 40 mg   chlorhexidine gluconate (MEDLINE KIT) (PERIDEX) 0.12 % solution 15 mL   famotidine (PEPCID) IVPB 20 mg premix   Technical-This is a multichannel digital EEG recording using the international 10-20 placement system.   Description of the recording Posterior background is 5-6hz  EEG comprises of generalized delta slowing with very low amplitude activity in left hemisphere intermixed with beta activity  Epileptiform activity was not seen  Impression Moderate to severely abnormal EEG and findings are suggestive of  Bilateral left more than right focal cerebral dysfunction suggesting underlying structural defect Epileptiform activity not seen

## 2016-02-01 NOTE — Progress Notes (Signed)
Echocardiogram 2D Echocardiogram has been performed.  Dorothey BasemanReel, Kellyanne Ellwanger M 06/04/2016, 12:26 PM

## 2016-02-01 NOTE — ED Triage Notes (Signed)
Patient arrives via EMS from home after wife called 911 related to patient being unresponsive with snoring respirations. Upon arrival FD found patient with wheezing in all lung fields. They administered nebulizer of albuterol @ 5mg ; initial BP by FD: 290/150; RA O2 sat by FD was 72%. Wife last saw patient normal @ 0130. She found him snoring and unresponsive shortly before 0200 today. [facility] reported that GCS for patient was 3 throughout their time with him. On arrival GCS remained @ 3. EMS attempted intubation, but was not able to obtain the airway. Respirations were being assisted via BVM upon arrival with NPA in place. Directly after arrival patient was given RSI medications and definitive airway was established by MD Delo. Significant JVD noted; skin cool and clammy; CBG 246 here.

## 2016-02-01 NOTE — Progress Notes (Signed)
Patient's blood pressure elevated a little over desired range of less than 180. Patient agitated and coughing at that time. Blood pressure recycled and improved.

## 2016-02-01 NOTE — Progress Notes (Signed)
[facility]  S: has been transferred to Neuro ICU  O:  Vitals:   01/22/2016 0800 01/19/2016 0805 02/04/2016 0810 02/08/2016 0835  BP: 132/89 133/86 130/92   Pulse: 63 62 62   Resp: 16 16 16    Temp:      TempSrc:      SpO2: 100% 100% 100% 96%  Weight:      Height:       Vent Mode: PRVC FiO2 (%):  [50 %-100 %] 50 % Set Rate:  [16 bmp] 16 bmp Vt Set:  [540 mL-650 mL] 650 mL PEEP:  [5 cmH20] 5 cmH20 Plateau Pressure:  [16 cmH20-23 cmH20] 16 cmH20  Gen: sedated on vent HENT: NCAT ETT in place PULM: coarse rhonchi, no wheezing, vent supported breathing CV: RRR, no mgr GI: BS+, soft, nontender MSK: normal bulk and tone Neuro: arouses, no eye opening, moves left spontaneously, only flicker movement R arm  CXR: upper lobe infiltrates  Impression:  Acute respiratory failure with hypoxemia Likely aspiration pneumonitis Systolic heart failure, possible exacerbation Hypertensive urgency Cocaine abuse Acute left MCA stroke Acute encephalopathy  Plan: Stroke team evaluating now> for repeat cerebral perfusion study vs IR STAT Continue full vent support, ventilator orders written Sedation orders written for PAD protocol Hold lasix Monitor BP, goal per neurology Stroke order set per neurology/stroke service Respiratory culture Unasyn per pharmacy  Additional cc time 45 minutes  Heber CarolinaBrent McQuaid, MD [facility] Pager: [phone] Cell: [phone] After 3pm or if no response, call [phone]

## 2016-02-01 NOTE — Progress Notes (Signed)
STROKE TEAM PROGRESS NOTE   HISTORY OF PRESENT ILLNESS (per record) Matthew Thornton is an 66 y.o. male who presents with unresponsive state and leftward gaze deviation. He was LKN at midnight, when his wife saw him in bed eating a candy cane. She later went to bed and awoke to go to the bathroom at about 1:30 AM, at which time she noted sonorous respirations with eyes open and unarousable. He also had mucus leaking from his mouth. He has had symptoms of a URI with prominent secretions for the past several days. He also has a history of seizures, usually occurring in sleep, but wife states that there was no evidence on her awaking that he was having or had just had a seizure. He usually has significant motor activity and bites his tongue with his seizures, and she noticed no changes or noises consistent with this tonight. He has a history of stroke 3 years ago and is on an antiplatelet agent, per wife, but not on an anticoagulant.   On arrival to the ED his GCS was 3. Decision was made to intubate and during the procedure he began moving his left arm but remained completely flaccid on the right, as well as with leftward gaze deviation. No seizure like activity was noted in the ED. For intubation he was sedated with propofol.   Additional information is obtained from ED note: "Upon arrival FD found patient with wheezing in all lung fields. They administered nebulizer of albuterol @ 5mg ; initial BP by FD: 290/150; RA O2 sat by FD was 72%. Wife last saw patient normal @ 0130. She found him snoring and unresponsive shortly before 0200 today. [facility] reported that GCS for patient was 3 throughout their time with him. On arrival GCS remained @ 3. EMS attempted intubation, but was not able to obtain the airway. Respirations were being assisted via BVM upon arrival with NPA in place. Directly after arrival patient was given RSI medications and definitive airway was established by MD Delo. Significant JVD noted; skin  cool and clammy; CBG 246."  Wife clarifies to Neurologist that he was LKN at 0000, not 0130. He is on ASA and atorvastatin at home. Recently started on a "hepatitis medicine" per wife. Wife states that he is on Keppra at home but is unsure of the dose.    SUBJECTIVE (INTERVAL HISTORY) No family is at the bedside.  He still intubated on sedation. Able to move left UE purposefully but minimal movement of RUE on pain stimulation. Had CTP repeat showed large left MCA infarct core. Not candidate for intervention anymore.    OBJECTIVE Temp:  [98.9 F (37.2 C)] 98.9 F (37.2 C) (01/14 0252) Pulse Rate:  [60-95] 60 (01/14 1000) Resp:  [14-29] 14 (01/14 1000) BP: (124-203)/(84-141) 130/84 (01/14 1000) SpO2:  [92 %-100 %] 100 % (01/14 1000) FiO2 (%):  [50 %-100 %] 50 % (01/14 0835) Weight:  [97.1 kg (214 lb 1.1 oz)] 97.1 kg (214 lb 1.1 oz) (01/14 0255)  CBC:   Recent Labs Lab 27-Feb-2016 0250 27-Feb-2016 0756  WBC 12.1* 9.1  NEUTROABS 5.0  --   HGB 15.8 14.9  HCT 47.3 44.4  MCV 74.5* 73.8*  PLT 163 149*    Basic Metabolic Panel:   Recent Labs Lab 2016/02/27 0250 02-27-16 0756  NA 138  --   K 4.0  --   CL 105  --   CO2 19*  --   GLUCOSE 254*  --   BUN 12  --  CREATININE 1.24 1.18  CALCIUM 8.8*  --     Lipid Panel:     Component Value Date/Time   CHOL 195 10/15/2012 0501   TRIG 93 10/15/2012 0501   HDL 38 (L) 10/15/2012 0501   CHOLHDL 5.1 10/15/2012 0501   VLDL 19 10/15/2012 0501   LDLCALC 138 (H) 10/15/2012 0501   HgbA1c:  Lab Results  Component Value Date   HGBA1C 6.7 (H) 10/15/2012   Urine Drug Screen:     Component Value Date/Time   LABOPIA NONE DETECTED 05/16/16 0615   COCAINSCRNUR POSITIVE (A) 05/16/16 0615   COCAINSCRNUR NEGATIVE 11/15/2007 1346   LABBENZ NONE DETECTED 05/16/16 0615   LABBENZ NEGATIVE 11/15/2007 1346   AMPHETMU NONE DETECTED 05/16/16 0615   THCU NONE DETECTED 05/16/16 0615   LABBARB NONE DETECTED 05/16/16 0615       IMAGING I have personally reviewed the radiological images below and agree with the radiology interpretations.  Ct Angio Head W Or Wo Contrast February 20, 2016 1. 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  2. CT findings of acute left MCA territory infarct.  3. Markedly limited examination due to poor contrast bolus and inaccurate field of view on the perfusion portion of the study.  Non-diagnostic perfusion scan.   Ct Head Wo Contrast February 20, 2016 1. No acute intracranial pathology seen on CT.  2. Mild cortical volume loss and scattered small vessel ischemic microangiopathy.  3. Chronic lacunar infarct at the corona radiata bilaterally, and at the basal ganglia.  4. Mucosal thickening at the right maxillary sinus.   Ct Cerebral Perfusion W Contrast Repeat February 20, 2016 1. Large left MCA territory completed infarct without sparing of the ACA territory.  2. Additional ischemic areas noted within the right hemisphere.  3. Distal left M1 occlusion.   Ct Cerebral Perfusion W Contrast February 20, 2016 Non-diagnostic perfusion scan.   Dg Chest Port 1 View February 20, 2016 1. Endotracheal tube seen ending 3 cm above the carina.  2. Enteric tube noted ending about the body of the stomach.  3. Vascular congestion and mild cardiomegaly. Hazy bilateral airspace opacification   may reflect bilateral pneumonia or pulmonary edema.   MRI Brain - pending   CUS pending  TTE pending  LE venous doppler pending   PHYSICAL EXAM  Temp:  [98.9 F (37.2 C)] 98.9 F (37.2 C) (01/14 0252) Pulse Rate:  [59-95] 59 (01/14 1100) Resp:  [14-29] 14 (01/14 1100) BP: (124-203)/(84-141) 146/94 (01/14 1100) SpO2:  [92 %-100 %] 100 % (01/14 1153) FiO2 (%):  [40 %-100 %] 40 % (01/14 1153) Weight:  [214 lb 1.1 oz (97.1 kg)] 214 lb 1.1 oz (97.1 kg) (01/14 0255)  General - Well nourished, well developed, intubated and on sedation.  Ophthalmologic - Fundi not visualized .  Cardiovascular - Regular rate and  rhythm.  Neuro - intubated and sedated. Not respond to voice, eyes closed. Eye middle position, PERRL but sluggish, no corneal or doll's eyes. Positive gag and cough. On pain stimulation, LUE against gravity and purposeful movement, RUE minimal withdraw. BLE minimal withdraw to pain, babinski negative. DTR diminished bilaterally. Sensation, coordination and gait not tested.   ASSESSMENT/PLAN Matthew Thornton is a 66 y.o. male with history of previous strokes, a seizure disorder (on Keppra), tobacco abuse, prediabetes, multiple lacunar infarcts, hypertension, hepatitis C, substance abuse, recent respiratory infection, and cardiomyopathy presenting with unresponsiveness, left gaze preference, and right hemiplegia. He did not receive IV t-PA due to late presentation.  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 Pt cocaine positive.  Resultant  Unresponsive and respiratory failure  MRI - pending   CTA head - left M1 occlusion  CTP - left MCA / ACA large infarct core, with non significant penumbra  CUS pending  TTE pending  LE venous doppler pending  EEG - pending   LDL - pending   HgbA1c - pending   VTE prophylaxis - subcutaneous heparin Diet NPO time specified  aspirin 325 mg daily prior to admission, now on aspirin 325 mg daily.   Ongoing aggressive stroke risk factor management  Therapy recommendations:  pending   Disposition: Pending  Hx of seizure  Nocturnal seizure at home  Last seizure one week ago  On home keppra  Was loaded with keppra in ED  EEG pending  Hx of stroke  3 years ago as per wife  No residue  CT head showed b/l BG/CR old lacunar infarcts, R>L  Cocaine abuse  UDS positive for cocaine  Wife admitted he take "quite a lot" cocaine 2 days ago  Hx of cocaine abuse in the past  CHF / aspiration  CXR - may reflect bilateral pneumonia or pulmonary edema.   IV Unasyn, IV Zithromax, and IV  Rocephin.  TTE pending  Hypertension  Stable  Permissive hypertension (OK if < 220/120) but gradually normalize in 5-7 days  Long-term BP goal normotensive  Hyperlipidemia  Home meds:  Lipitor 40 mg daily   LDL pending, goal < 70  Resumed lipitor 40  Continue statin at discharge  Diabetes  HgbA1c pending, goal < 7.0  Uncontrolled  Hyperglycemia  Tobacco abuse  Current smoker  Smoking cessation counseling will be provided  Other Stroke Risk Factors  Advanced age  ETOH use, will be advised to abstain from alcohol.  Overweight, Body mass index is 27.48 kg/m., recommend weight loss, diet and exercise as appropriate   Other Active Problems  Hepatitis C history - HIV, RPR, B-12, folate, and TSH pending     Hospital day # 0  This patient is critically ill due to left brain large infarct, possible seizure, CHF, respiratory failure and at significant risk of neurological worsening, death form recurrent stroke, hemorrhagic transformation, status epilepticus, heart failure. This patient's care requires constant monitoring of vital signs, hemodynamics, respiratory and cardiac monitoring, review of multiple databases, neurological assessment, discussion with family, other specialists and medical decision making of high complexity. I spent 50 minutes of neurocritical care time in the care of this patient.  Marvel Plan, MD PhD Stroke Neurology 02-29-2016 12:15 PM    To contact Stroke Continuity provider, please refer to [email or web address]. After hours, contact General Neurology

## 2016-02-01 NOTE — Consult Note (Addendum)
NEURO HOSPITALIST CONSULT NOTE   Requestig physician: Dr. Judd Lienelo  Reason for Consult: Unresponsiveness with eye deviation prior to intubation  History obtained from:  Wife and Chart     HPI:                                                                                                                                          Matthew Thornton is an 66 y.o. male who presents with unresponsive state and leftward gaze deviation. He was LKN at midnight, when his wife saw him in bed eating a candy cane. She later went to bed and awoke to go to the bathroom at about 1:30 AM, at which time she noted sonorous respirations with eyes open and unarousable. He also had mucus leaking from his mouth. He has had symptoms of a URI with prominent secretions for the past several days. He also has a history of seizures, usually occurring in sleep, but wife states that there was no evidence on her awaking that he was having or had just had a seizure. He usually has significant motor activity and bites his tongue with his seizures, and she noticed no changes or noises consistent with this tonight. He has a history of stroke 3 years ago and is on an antiplatelet agent, per wife, but not on an anticoagulant.   On arrival to the ED his GCS was 3. Decision was made to intubate and during the procedure he began moving his left arm but remained completely flaccid on the right, as well as with leftward gaze deviation. No seizure like activity was noted in the ED. For intubation he was sedated with propofol.   Additional information is obtained from ED note: "Upon arrival FD found patient with wheezing in all lung fields. They administered nebulizer of albuterol @ 5mg ; initial BP by FD: 290/150; RA O2 sat by FD was 72%. Wife last saw patient normal @ 0130. She found him snoring and unresponsive shortly before 0200 today. [facility] reported that GCS for patient was 3 throughout their time with him. On arrival GCS  remained @ 3. EMS attempted intubation, but was not able to obtain the airway. Respirations were being assisted via BVM upon arrival with NPA in place. Directly after arrival patient was given RSI medications and definitive airway was established by MD Delo. Significant JVD noted; skin cool and clammy; CBG 246."  Wife clarifies to Neurologist that he was LKN at 0000, not 0130. He is on ASA and atorvastatin at home. Recently started on a "hepatitis medicine" per wife. Wife states that he is on Keppra at home but is unsure of the dose.   Past Medical History:  Diagnosis Date  . Cardiomyopathy   . Cocaine abuse   . Hepatitis C  S/p interferon therapy  . Hypertension   . Multiple lacunar infarcts (HCC)   . Pre-diabetes   . Seizures (HCC)   . Tobacco abuse     Past Surgical History:  Procedure Laterality Date  . BACK SURGERY     History reviewed. No pertinent family history.  Social History:  reports that he has been smoking Cigarettes.  He has been smoking about 0.25 packs per day. He has never used smokeless tobacco. He reports that he drinks alcohol. He reports that he uses drugs, including Marijuana.  Allergies  Allergen Reactions  . Morphine And Related     Hiccups    MEDICATIONS:                                                                                                                     (PROVENTIL HFA;VENTOLIN HFA) 108 (90 BASE) MCG/ACT inhaler Inhale 2 puffs into the lungs every 6 (six) hours as needed for shortness of breath.  Historical Provider, MD Needs Review  amLODipine (NORVASC) 5 MG tablet Take 1 tablet (5 mg total) by mouth daily. Linward Headland, MD Needs Review  amoxicillin (AMOXIL) 500 MG capsule Take 1 capsule (500 mg total) by mouth 3 (three) times daily. Hope Orlene Och, NP Needs Review   Patient not taking: Reported on 12/20/2014    aspirin 325 MG tablet Take 1 tablet (325 mg total) by mouth daily. Ejiroghene Wendall Stade, MD Needs Review  atorvastatin (LIPITOR)  40 MG tablet Take 1 tablet (40 mg total) by mouth daily at 6 PM. Ejiroghene E Mariea Clonts, MD Needs Review  gabapentin (NEURONTIN) 600 MG tablet Take 600 mg by mouth 4 (four) times daily.  Historical Provider, MD Needs Review  glipiZIDE (GLUCOTROL) 2.5 mg TABS tablet Take 0.5 tablets (2.5 mg total) by mouth daily before breakfast. Ejiroghene Wendall Stade, MD Needs Review  HYDROcodone-acetaminophen (NORCO) 5-325 MG per tablet Take 1 tablet by mouth every 6 (six) hours as needed for moderate pain. Hope Orlene Och, NP Needs Review   Patient not taking: Reported on 12/20/2014    lisinopril (PRINIVIL,ZESTRIL) 40 MG tablet Take 1 tablet (40 mg total) by mouth daily. Ejiroghene Wendall Stade, MD Needs Review  mirtazapine (REMERON) 15 MG tablet Take 15 mg by mouth at bedtime. Historical Provider, MD Needs Review  naproxen (NAPROSYN) 500 MG tablet Take 500 mg by mouth 2 (two) times daily as needed for mild pain (for pain).  Historical Provider, MD Needs Review  ranitidine (ZANTAC) 150 MG tablet Take 150 mg by mouth 2 (two) times daily.  Historical Provider, MD Needs Review  valACYclovir (VALTREX) 500 MG tablet Take 500 mg by mouth 2 (two) times daily. Historical Provider, MD Needs Review    ROS:  Unable to obtain additional ROS from patient due to AMS/sedation.    Blood pressure (!) 201/141, temperature 98.9 F (37.2 C), temperature source Rectal, height 6\' 2"  (1.88 m), weight 97.1 kg (214 lb 1.1 oz), SpO2 97 %.  General Examination:                                                                                                      HEENT-  Normocephalic/atraumatic. Lungs- Intubated on ventilator.  Extremities- Warm and well-perfused.   Neurological Examination Mental Status: Intubated and sedated on propofol. Will arouse to minimally conscious state and move LUE semipurposefully  after deep noxious stinulation. Attempts to pull at tubing with LUE when partially aroused by deep noxious. Does not fixate or attempt to communicate.  Cranial Nerves: II: PERRL. No blink to threat (sedated).  III,IV, VI: Eyes deviated to left conjugately and do not cross the midline with doll's eye maneuver.  V,VII: Face is flaccidly symmetric (intubated and sedated) VIII: no response to auditory stimuli IX,X: intubated XI: unable to formally assess XII: intubated Motor/Sensory:  Moves LUE antigravity to noxious. RUE flaccid with no movement to noxious. Withdraws LLE greater than RLE to noxious.  Deep Tendon Reflexes: 2+ and symmetric throughout. No definite Babinski reflex elicitable.  Cerebellar/Gait: Unable to assess.   Lab Results: Basic Metabolic Panel:  Recent Labs Lab 01/27/2016 0250  NA 138  K 4.0  CL 105  CO2 19*  GLUCOSE 254*  BUN 12  CREATININE 1.24  CALCIUM 8.8*    Liver Function Tests: No results for input(s): AST, ALT, ALKPHOS, BILITOT, PROT, ALBUMIN in the last 168 hours. No results for input(s): LIPASE, AMYLASE in the last 168 hours. No results for input(s): AMMONIA in the last 168 hours.  CBC:  Recent Labs Lab 02/12/2016 0250  WBC 12.1*  NEUTROABS 5.0  HGB 15.8  HCT 47.3  MCV 74.5*  PLT 163    Cardiac Enzymes:  Recent Labs Lab 02/13/2016 0250  TROPONINI 0.30*    Lipid Panel: No results for input(s): CHOL, TRIG, HDL, CHOLHDL, VLDL, LDLCALC in the last 168 hours.  CBG: No results for input(s): GLUCAP in the last 168 hours.  Microbiology: Results for orders placed or performed during the hospital encounter of 11/21/07  Urine culture     Status: None   Collection Time: 11/15/07  1:46 PM  Result Value Ref Range Status   Specimen Description URINE, CLEAN CATCH  Final   Special Requests NONE  Final   Colony Count NO GROWTH  Final   Culture NO GROWTH  Final   Report Status 11/17/2007 FINAL  Final    Coagulation Studies: No results for  input(s): LABPROT, INR in the last 72 hours.  Imaging: Ct Head Wo Contrast  Result Date: 02/15/2016 CLINICAL DATA:  Acute onset of altered level of consciousness. Initial encounter. EXAM: CT HEAD WITHOUT CONTRAST TECHNIQUE: Contiguous axial images were obtained from the base of the skull through the vertex without intravenous contrast. COMPARISON:  CT of the head performed 04/03/2015 FINDINGS: Brain: No evidence of acute infarction, hemorrhage, hydrocephalus, extra-axial  collection or mass lesion/mass effect. Prominence of the ventricles and sulci reflects mild cortical volume loss. Mild cerebellar atrophy is noted. Scattered periventricular and subcortical white matter change likely reflects small vessel ischemic microangiopathy. Chronic lacunar infarcts are noted at the corona radiata bilaterally, and at the basal ganglia. The brainstem and fourth ventricle are within normal limits. The cerebral hemispheres demonstrate grossly normal gray-white differentiation. No mass effect or midline shift is seen. Vascular: No hyperdense vessel or unexpected calcification. Skull: There is no evidence of fracture; visualized osseous structures are unremarkable in appearance. Sinuses/Orbits: The orbits are within normal limits. Mucosal thickening is noted at the right maxillary sinus. The remaining paranasal sinuses and mastoid air cells are well-aerated. Other: No significant soft tissue abnormalities are seen. IMPRESSION: 1. No acute intracranial pathology seen on CT. 2. Mild cortical volume loss and scattered small vessel ischemic microangiopathy. 3. Chronic lacunar infarct at the corona radiata bilaterally, and at the basal ganglia. 4. Mucosal thickening at the right maxillary sinus. 5. Electronically Signed   By: Roanna Raider M.D.   On: 02/12/2016 03:29   Dg Chest Port 1 View  Result Date: 01/31/2016 CLINICAL DATA:  Endotracheal tube and orogastric tube placement. Initial encounter. EXAM: PORTABLE CHEST 1 VIEW  COMPARISON:  Chest radiograph performed 06/06/2014 FINDINGS: The patient's endotracheal tube is seen ending 3 cm above the carina. An enteric tube is noted ending about the body of the stomach. Vascular congestion is noted. Hazy bilateral airspace opacification may reflect bilateral pneumonia or pulmonary edema. No pleural effusion or pneumothorax is seen. The cardiomediastinal silhouette is mildly enlarged. No acute osseous abnormalities are seen. IMPRESSION: 1. Endotracheal tube seen ending 3 cm above the carina. 2. Enteric tube noted ending about the body of the stomach. 3. Vascular congestion and mild cardiomegaly. Hazy bilateral airspace opacification may reflect bilateral pneumonia or pulmonary edema. Electronically Signed   By: Roanna Raider M.D.   On: 01/27/2016 03:16    Assessment: 1. Unresponsiveness followed by recovery of left sided movement with plegic RUE  2.  CT reveals no acute intracranial pathology. Mild cortical volume loss and scattered small vessel ischemic microangiopathic changes. Chronic lacunar infarct at the corona radiata bilaterally, and at the basal ganglia. 3. CTA poor quality due to malfunction of auto-timing of scan with bolus. Limited imaging reveals no definite LVO on my preliminary review. Awaiting official intepretation.  4. CT perfusion interpretation by Radiology is pending.   Recommendations: 1. At completion of CTA he is 5 hours 15 minutes since symptom onset. Not a candidate for IV tPA based on time criteria.  2. Not an endovascular candidate as he is beginning to move LLE after CTA/CTP, eyes have moved back to midline and endovascular team logistics preclude mobilizing team and achieving puncture within the next 45 minutes.  3. Seizure with postictal state also relatively high on the differential. Loading Keppra 1000 mg IV x 1. Recommend starting scheduled Keppra at 750 mg BID.  4. STAT cEEG has been ordered.    45 minutes of critical care time spent in the  evaluation and management of this patient.   Electronically signed: Dr. Caryl Pina 02/09/2016, 4:05 AM

## 2016-02-01 NOTE — ED Notes (Signed)
Hand control mitt applied to left hand.

## 2016-02-01 NOTE — ED Notes (Signed)
Called lab to add on HFP 

## 2016-02-02 ENCOUNTER — Inpatient Hospital Stay (HOSPITAL_COMMUNITY): Payer: Medicare PPO

## 2016-02-02 DIAGNOSIS — J9601 Acute respiratory failure with hypoxia: Secondary | ICD-10-CM

## 2016-02-02 DIAGNOSIS — J9600 Acute respiratory failure, unspecified whether with hypoxia or hypercapnia: Secondary | ICD-10-CM

## 2016-02-02 DIAGNOSIS — F1410 Cocaine abuse, uncomplicated: Secondary | ICD-10-CM

## 2016-02-02 DIAGNOSIS — F17200 Nicotine dependence, unspecified, uncomplicated: Secondary | ICD-10-CM

## 2016-02-02 DIAGNOSIS — E785 Hyperlipidemia, unspecified: Secondary | ICD-10-CM

## 2016-02-02 DIAGNOSIS — G935 Compression of brain: Secondary | ICD-10-CM

## 2016-02-02 DIAGNOSIS — I161 Hypertensive emergency: Secondary | ICD-10-CM

## 2016-02-02 DIAGNOSIS — I5041 Acute combined systolic (congestive) and diastolic (congestive) heart failure: Secondary | ICD-10-CM

## 2016-02-02 LAB — GLUCOSE, CAPILLARY
GLUCOSE-CAPILLARY: 141 mg/dL — AB (ref 65–99)
Glucose-Capillary: 246 mg/dL — ABNORMAL HIGH (ref 65–99)
Glucose-Capillary: 142 mg/dL — ABNORMAL HIGH (ref 65–99)
Glucose-Capillary: 109 mg/dL — ABNORMAL HIGH (ref 65–99)
Glucose-Capillary: 204 mg/dL — ABNORMAL HIGH (ref 65–99)
Glucose-Capillary: 181 mg/dL — ABNORMAL HIGH (ref 65–99)
Glucose-Capillary: 132 mg/dL — ABNORMAL HIGH (ref 65–99)

## 2016-02-02 LAB — MAGNESIUM
MAGNESIUM: 2 mg/dL (ref 1.7–2.4)
Magnesium: 2.2 mg/dL (ref 1.7–2.4)
Magnesium: 2.1 mg/dL (ref 1.7–2.4)

## 2016-02-02 LAB — BASIC METABOLIC PANEL
ANION GAP: 11 (ref 5–15)
BUN: 9 mg/dL (ref 6–20)
CALCIUM: 9.2 mg/dL (ref 8.9–10.3)
CO2: 26 mmol/L (ref 22–32)
Chloride: 104 mmol/L (ref 101–111)
Creatinine, Ser: 1.26 mg/dL — ABNORMAL HIGH (ref 0.61–1.24)
GFR, EST NON AFRICAN AMERICAN: 57 mL/min — AB (ref >60)
GLUCOSE: 137 mg/dL — AB (ref 65–99)
Potassium: 3.9 mmol/L (ref 3.5–5.1)
Sodium: 141 mmol/L (ref 135–145)

## 2016-02-02 LAB — CBC WITH DIFFERENTIAL/PLATELET
Basophils Absolute: 0 10*3/uL (ref 0.0–0.1)
Basophils Relative: 0 %
EOS ABS: 0 10*3/uL (ref 0.0–0.7)
EOS PCT: 0 %
HCT: 45.8 % (ref 39.0–52.0)
Hemoglobin: 15.5 g/dL (ref 13.0–17.0)
LYMPHS ABS: 1.7 10*3/uL (ref 0.7–4.0)
Lymphocytes Relative: 17 %
MCH: 25 pg — AB (ref 26.0–34.0)
MCHC: 33.8 g/dL (ref 30.0–36.0)
MCV: 73.8 fL — ABNORMAL LOW (ref 78.0–100.0)
MONO ABS: 0.9 10*3/uL (ref 0.1–1.0)
Monocytes Relative: 10 %
Neutro Abs: 7.2 10*3/uL (ref 1.7–7.7)
Neutrophils Relative %: 73 %
PLATELETS: 148 10*3/uL — AB (ref 150–400)
RBC: 6.21 MIL/uL — AB (ref 4.22–5.81)
RDW: 16 % — AB (ref 11.5–15.5)
WBC: 9.8 10*3/uL (ref 4.0–10.5)

## 2016-02-02 LAB — PHOSPHORUS
PHOSPHORUS: 3.9 mg/dL (ref 2.5–4.6)
PHOSPHORUS: 4.2 mg/dL (ref 2.5–4.6)
Phosphorus: 3.6 mg/dL (ref 2.5–4.6)

## 2016-02-02 LAB — HIV ANTIBODY (ROUTINE TESTING W REFLEX): HIV SCREEN 4TH GENERATION: NONREACTIVE

## 2016-02-02 LAB — OSMOLALITY
OSMOLALITY: 304 mosm/kg — AB (ref 275–295)
Osmolality: 319 mOsm/kg — ABNORMAL HIGH (ref 275–295)

## 2016-02-02 LAB — HEMOGLOBIN A1C
Hgb A1c MFr Bld: 6.9 % — ABNORMAL HIGH (ref 4.8–5.6)
MEAN PLASMA GLUCOSE: 151 mg/dL

## 2016-02-02 MED ORDER — VITAL HIGH PROTEIN PO LIQD
1000.0000 mL | ORAL | Status: DC
  Administered 2016-02-03: 1000 mL

## 2016-02-02 MED ORDER — MANNITOL 20 % IV SOLN
100.0000 g | Status: AC
  Administered 2016-02-02: 100 g via INTRAVENOUS
  Filled 2016-02-02: qty 500

## 2016-02-02 MED ORDER — LABETALOL HCL 5 MG/ML IV SOLN
20.0000 mg | INTRAVENOUS | Status: DC | PRN
  Administered 2016-02-02 – 2016-02-03 (×4): 20 mg via INTRAVENOUS
  Filled 2016-02-02 (×4): qty 4

## 2016-02-02 MED ORDER — MANNITOL 20 % IV SOLN
50.0000 g | Freq: Four times a day (QID) | Status: DC
  Administered 2016-02-02 – 2016-02-03 (×3): 50 g via INTRAVENOUS
  Filled 2016-02-02 (×4): qty 250

## 2016-02-02 MED ORDER — SODIUM CHLORIDE 0.9 % IV SOLN
1000.0000 mg | Freq: Two times a day (BID) | INTRAVENOUS | Status: DC
  Administered 2016-02-02 – 2016-02-03 (×2): 1000 mg via INTRAVENOUS
  Filled 2016-02-02 (×3): qty 10

## 2016-02-02 MED ORDER — PRO-STAT SUGAR FREE PO LIQD
30.0000 mL | Freq: Two times a day (BID) | ORAL | Status: DC
  Administered 2016-02-02: 30 mL
  Filled 2016-02-02: qty 30

## 2016-02-02 MED ORDER — ACETAMINOPHEN 160 MG/5ML PO SOLN
650.0000 mg | Freq: Four times a day (QID) | ORAL | Status: DC | PRN
  Administered 2016-02-02: 650 mg
  Filled 2016-02-02: qty 20.3

## 2016-02-02 MED ORDER — LABETALOL HCL 5 MG/ML IV SOLN: 20.0000 mg | INTRAVENOUS | Status: DC | PRN

## 2016-02-02 MED ORDER — MANNITOL 20 % IV SOLN: 100.0000 g | Freq: Once | INTRAVENOUS | Status: DC

## 2016-02-02 MED ORDER — VITAL HIGH PROTEIN PO LIQD
1000.0000 mL | ORAL | Status: DC
  Administered 2016-02-02: 16:00:00
  Administered 2016-02-02: 1000 mL

## 2016-02-02 NOTE — Progress Notes (Signed)
STROKE TEAM PROGRESS NOTE   SUBJECTIVE (INTERVAL HISTORY) Wife and sister are at the bedside.  He still intubated on sedation. Not able to tolerate weaning trials. Left pupil dilated and fixed. MRI yesterday showed left MCA, ACA and PCA infarct. Currently sign of uncal herniation. Discussed with wife and sister that currently condition indicates that pt likely not able to survive due to large infarct, increased intracranial pressure, and uncal herniation. They needs time to discuss with family and [facility]. They agree with DNR at meantime. Will start mannitol, but not surgical candidate.     OBJECTIVE Temp:  [97.1 F (36.2 C)-99.2 F (37.3 C)] 98.6 F (37 C) (01/15 0800) Pulse Rate:  [55-71] 55 (01/15 1100) Resp:  [14-25] 14 (01/15 1100) BP: (139-184)/(84-110) 147/86 (01/15 1100) SpO2:  [96 %-100 %] 100 % (01/15 1100) FiO2 (%):  [40 %] 40 % (01/15 0819) Weight:  [201 lb 4.5 oz (91.3 kg)] 201 lb 4.5 oz (91.3 kg) (01/15 0410)  CBC:   Recent Labs Lab 02/10/2016 0250 01/25/2016 0756 02/02/16 0313  WBC 12.1* 9.1 9.8  NEUTROABS 5.0  --  7.2  HGB 15.8 14.9 15.5  HCT 47.3 44.4 45.8  MCV 74.5* 73.8* 73.8*  PLT 163 149* 148*    Basic Metabolic Panel:   Recent Labs Lab 02/09/2016 0250 01/25/2016 0756 02/02/16 0313 02/02/16 1046  NA 138  --  141  --   K 4.0  --  3.9  --   CL 105  --  104  --   CO2 19*  --  26  --   GLUCOSE 254*  --  137*  --   BUN 12  --  9  --   CREATININE 1.24 1.18 1.26*  --   CALCIUM 8.8*  --  9.2  --   MG  --   --  2.2 2.1  PHOS  --   --  3.6 3.9    Lipid Panel:     Component Value Date/Time   CHOL 162 02/02/2016 1046   TRIG 117 02/10/2016 1046   TRIG 119 02/09/2016 1046   HDL 38 (L) 02/11/2016 1046   CHOLHDL 4.3 02/02/2016 1046   VLDL 23 01/31/2016 1046   LDLCALC 101 (H) 02/12/2016 1046   HgbA1c:  Lab Results  Component Value Date   HGBA1C 6.9 (H) 02/06/2016   Urine Drug Screen:     Component Value Date/Time   LABOPIA NONE DETECTED 01/21/2016 0615    COCAINSCRNUR POSITIVE (A) 01/26/2016 0615   COCAINSCRNUR NEGATIVE 11/15/2007 1346   LABBENZ NONE DETECTED 01/24/2016 0615   LABBENZ NEGATIVE 11/15/2007 1346   AMPHETMU NONE DETECTED 02/04/2016 0615   THCU NONE DETECTED 01/21/2016 0615   LABBARB NONE DETECTED 01/25/2016 0615      IMAGING I have personally reviewed the radiological images below and agree with the radiology interpretations.  Ct Angio Head W Or Wo Contrast 02/10/2016 1. 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  2. CT findings of acute left MCA territory infarct.  3. Markedly limited examination due to poor contrast bolus and inaccurate field of view on the perfusion portion of the study.  Non-diagnostic perfusion scan.   Ct Head Wo Contrast 01/24/2016 1. No acute intracranial pathology seen on CT.  2. Mild cortical volume loss and scattered small vessel ischemic microangiopathy.  3. Chronic lacunar infarct at the corona radiata bilaterally, and at the basal ganglia.  4. Mucosal thickening at the right maxillary sinus.   Ct Cerebral Perfusion W  Contrast Repeat 01/19/2016 1. Large left MCA territory completed infarct without sparing of the ACA territory.  2. Additional ischemic areas noted within the right hemisphere.  3. Distal left M1 occlusion.   Ct Cerebral Perfusion W Contrast 02/05/2016 Non-diagnostic perfusion scan.   Dg Chest Port 1 View 02/11/2016 1. Endotracheal tube seen ending 3 cm above the carina.  2. Enteric tube noted ending about the body of the stomach.  3. Vascular congestion and mild cardiomegaly. Hazy bilateral airspace opacification   may reflect bilateral pneumonia or pulmonary edema.   Mr Brain Wo Contrast 01/28/2016 IMPRESSION: Multifocal areas of acute infarction as described, primarily LEFT hemisphere, but also involve RIGHT hemisphere subcortical white matter, and BILATERAL LEFT greater than RIGHT cerebellum. Areas of LEFT ACA, LEFT MCA, and LEFT PCA infarction are  observed. Early petechial hemorrhagic transformation is noted. Apparent recent occlusion of the distal LEFT vertebral contributing to the LEFT inferior cerebellar stroke. LEFT MCA bifurcation thrombus/embolus.   Portable Chest Xray 02/02/2016 IMPRESSION: 1. Support apparatus, as above. 2. Complete resolution of mid to upper lung airspace consolidation compared to the prior study, with new bilateral lower lung opacities which may reflect areas of atelectasis and/or airspace consolidation with superimposed small bilateral pleural effusions (left greater than right).   TTE pending   PHYSICAL EXAM  Temp:  [97.1 F (36.2 C)-99.2 F (37.3 C)] 98.6 F (37 C) (01/15 0800) Pulse Rate:  [55-71] 55 (01/15 1100) Resp:  [14-25] 14 (01/15 1100) BP: (139-184)/(84-110) 147/86 (01/15 1100) SpO2:  [96 %-100 %] 100 % (01/15 1100) FiO2 (%):  [40 %] 40 % (01/15 0819) Weight:  [201 lb 4.5 oz (91.3 kg)] 201 lb 4.5 oz (91.3 kg) (01/15 0410)  General - Well nourished, well developed, intubated and on sedation.  Ophthalmologic - Fundi not visualized .  Cardiovascular - Regular rate and rhythm.  Neuro - intubated and sedated. Not respond to voice, eyes closed. Eye middle position, left pupil 5mm, fixed not reactive to light, right pupil 1.32mm, not reactive to light, no corneal or doll's eyes. weak gag and cough. On pain stimulation, no movement of all extremities, DTR diminished, babinski negative. Sensation, coordination and gait not tested.   ASSESSMENT/PLAN Mr. TAZ VANNESS is a 66 y.o. male with history of previous strokes, a seizure disorder (on Keppra), tobacco abuse, prediabetes, multiple lacunar infarcts, hypertension, hepatitis C, substance abuse, recent respiratory infection, and cardiomyopathy presenting with unresponsiveness, left gaze preference, and right hemiplegia. He did not receive IV t-PA due to late presentation.  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 Pt cocaine positive.  Resultant  Unresponsive, brain herniation and respiratory failure  MRI - large left MCA, ACA and PCA infarcts, left cerebellar, and punctate right cerebellar and right MCA infarcts  CTA head - left M1 occlusion  CTP - left MCA / ACA large infarct core, with non significant penumbra  CUS cancelled  TTE pending  LE venous doppler cancelled  EEG - generalized delta slowing, L>R, no seizure   LDL - 101   HgbA1c - 6.9   VTE prophylaxis - subcutaneous heparin Diet NPO time specified  aspirin 325 mg daily prior to admission, now on aspirin 325 mg daily.   Ongoing aggressive stroke risk factor management  Therapy recommendations:  pending   Disposition: Pending  Uncal herniation  Significant left pan hemisphere infarct  Left pupil dilated and fixed  No movement on pain stimulation  Not surgical candidate due to grave  prognosis  Likely not survivable at this time  Wife and sister would like to discuss with family and [facility]  Code status DNR  Will initiate mannitol at meantime, instead of 3% saline which requires central line and frequent sodium check.   Hx of seizure  Nocturnal seizure at home  Last seizure one week ago  On home keppra 1g bid  Was loaded with keppra in ED  EEG generalized delta slowing, L>R, no seizure  Hx of stroke  3 years ago as per wife  No residue  CT head showed b/l BG/CR old lacunar infarcts, R>L  Cocaine abuse  UDS positive for cocaine  Wife admitted he take "quite a lot" cocaine 2 days ago  Hx of cocaine abuse in the past  CHF / aspiration  CXR - may reflect bilateral pneumonia or pulmonary edema.   IV Unasyn, IV Zithromax, and IV Rocephin.  TTE pending  Hypertension  Stable  Permissive hypertension (OK if < 220/120) but gradually normalize in 5-7 days  Long-term BP goal normotensive  Hyperlipidemia  Home meds:  Lipitor 40 mg daily   LDL pending, goal < 70  Resumed  lipitor 40  Diabetes  HgbA1c 6.9, goal < 7.0  Under controlled  SSI  Tobacco abuse  Current smoker  Other Stroke Risk Factors  Advanced age  ETOH use, will be advised to abstain from alcohol.  Overweight, Body mass index is 25.84 kg/m., recommend weight loss, diet and exercise as appropriate   Other Active Problems  Hepatitis C history - HIV, RPR pending   Hospital day # 1  This patient is critically ill due to left brain large infarct, possible seizure, CHF, respiratory failure and at significant risk of neurological worsening, death form recurrent stroke, hemorrhagic transformation, status epilepticus, heart failure. This patient's care requires constant monitoring of vital signs, hemodynamics, respiratory and cardiac monitoring, review of multiple databases, neurological assessment, discussion with family, other specialists and medical decision making of high complexity. I spent 50 minutes of neurocritical care time in the care of this patient.   I had long discussion with wife and sister at bedside. Pt currently has uncal herniation. With large left pan hemisphere infarct and uncal herniation, he is not likely survivable from this insult. He is not a surgical candidate at this time due to grave prognosis. Family needs time to discuss among themselves and with [facility]. Will keep life support now but DNR status. Will do mannitol for now.   Marvel Plan, MD PhD Stroke Neurology 02/02/2016 11:52 AM    To contact Stroke Continuity provider, please refer to [email or web address]. After hours, contact General Neurology

## 2016-02-02 NOTE — Progress Notes (Signed)
Initial Nutrition Assessment  DOCUMENTATION CODES:   Non-severe (moderate) malnutrition in context of chronic illness  INTERVENTION:    Vital High Protein at 65 ml/h (1560 ml per day) to provide 1560 kcal, 137 gm protein, 1304 ml free water daily   Total calorie intake with TF + Propofol = 1948 kcals  NUTRITION DIAGNOSIS:   Inadequate oral intake related to inability to eat as evidenced by NPO status.  GOAL:   Patient will meet greater than or equal to 90% of their needs  MONITOR:   Vent status, TF tolerance, I & O's, Labs  REASON FOR ASSESSMENT:   Consult, Ventilator Enteral/tube feeding initiation and management  ASSESSMENT:   66 yo male with PMH of mixed systolic and diastolic HF, hepatitis C, hypertension, lacunar infarcts, seizures, tobacco and cocaine abuse, who presented to the ED from home via EMS after a seizure like event. Admitted on 1/14 with VDRF, acute left MCA stroke, and suspected aspiration pneumonitis.   Received MD Consult for TF initiation and management. Nutrition-Focused physical exam completed. Findings are mild-moderate fat depletion, mild-moderate muscle depletion, and no edema.  Patient is currently intubated on ventilator support MV: 8.6 L/min Temp (24hrs), Avg:98.6 F (37 C), Min:97.7 F (36.5 C), Max:99.2 F (37.3 C)  Propofol: 14.7 ml/hr providing 388 kcal per day  Labs reviewed. CBG's: 141-142 Medications reviewed and include mannitol, propofol.  Diet Order:  Diet NPO time specified  Skin:  Reviewed, no issues  Last BM:  PTA  Height:   Ht Readings from Last 1 Encounters:  02/13/2016 6\' 2"  (1.88 m)    Weight:   Wt Readings from Last 1 Encounters:  02/02/16 201 lb 4.5 oz (91.3 kg)    Ideal Body Weight:  86.4 kg  BMI:  Body mass index is 25.84 kg/m.  Estimated Nutritional Needs:   Kcal:  1976  Protein:  125-145 gm  Fluid:  2 L  EDUCATION NEEDS:   No education needs identified at this time  Joaquin CourtsKimberly Thos Matsumoto,  RD, LDN, CNSC Pager [phone] After Hours Pager [phone]

## 2016-02-02 NOTE — Progress Notes (Signed)
PULMONARY / CRITICAL CARE MEDICINE   Name: Matthew Thornton MRN: [id number] DOB: 10/24/1950    ADMISSION DATE:  01/30/2016 CONSULTATION DATE:    REFERRING MD:  EDP  CHIEF COMPLAINT:  Altered mental status  HISTORY OF PRESENT ILLNESS:   Mr. 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 Was also wheezing and hypoxic. He was admitted to Neuro ICU with acute left MCA CVA in the setting of  R MI occlusion (embolic pattern). Also presented with hypertensive emergency secondary to "heavy" cocaine abuse.  PAST MEDICAL HISTORY :  He  has a past medical history of Cardiomyopathy; Cocaine abuse; Hepatitis C; Hypertension; Multiple lacunar infarcts (HCC); Pre-diabetes; Seizures (HCC); and Tobacco abuse.  PAST SURGICAL HISTORY: He  has a past surgical history that includes Back surgery.  Allergies  Allergen Reactions  . Morphine And Related Other (See Comments)    Hiccups  . Clotrimazole Rash and Other (See Comments)    Patient developed WELTS on his skin because of this    No current facility-administered medications on file prior to encounter.    Current Outpatient Prescriptions on File Prior to Encounter  Medication Sig  . albuterol (PROVENTIL HFA;VENTOLIN HFA) 108 (90 BASE) MCG/ACT inhaler Inhale 2 puffs into the lungs every 6 (six) hours as needed for shortness of breath.   Marland Kitchen. atorvastatin (LIPITOR) 40 MG tablet Take 1 tablet (40 mg total) by mouth daily at 6 PM.  . gabapentin (NEURONTIN) 600 MG tablet Take 600 mg by mouth 4 (four) times daily.    Marland Kitchen. glipiZIDE (GLUCOTROL) 2.5 mg TABS tablet Take 0.5 tablets (2.5 mg total) by mouth daily before breakfast.  . lisinopril (PRINIVIL,ZESTRIL) 40 MG tablet  Take 1 tablet (40 mg total) by mouth daily.  . naproxen (NAPROSYN) 500 MG tablet Take 250 mg by mouth 2 (two) times daily as needed (for pain).   . ranitidine (ZANTAC) 150 MG tablet Take 150 mg by mouth 2 (two) times daily.    Marland Kitchen. amLODipine (NORVASC) 5 MG tablet Take 1 tablet (5 mg total) by mouth daily. (Patient not taking: Reported on 01/19/2016)  . amoxicillin (AMOXIL) 500 MG capsule Take 1 capsule (500 mg total) by mouth 3 (three) times daily. (Patient not taking: Reported on 02/07/2016)  . aspirin 325 MG tablet Take 1 tablet (325 mg total) by mouth daily. (Patient not taking: Reported on 01/26/2016)  . HYDROcodone-acetaminophen (NORCO) 5-325 MG per tablet Take 1 tablet by mouth every 6 (six) hours as needed for moderate pain. (Patient not taking: Reported on 01/27/2016)  . valACYclovir (VALTREX) 500 MG tablet Take 500 mg by mouth 2 (two) times daily.    FAMILY HISTORY:  His has no family status information on file.    SOCIAL HISTORY: He  reports that he has been smoking Cigarettes.  He has been smoking about 0.25 packs per day. He has never used smokeless tobacco. He reports that he drinks alcohol. He reports that he uses drugs, including Marijuana.  REVIEW OF SYSTEMS:   Unable to obtain 2/2 intubated state  SUBJECTIVE:  Neuro examination worsening this morning with dilated pupil on L per neuro.   VITAL SIGNS: BP (!) 156/96   Pulse (!) 55   Temp 98.6 F (37 C) (  Axillary)   Resp 14   Ht 6\' 2"  (1.88 m)   Wt 91.3 kg (201 lb 4.5 oz)   SpO2 100%   BMI 25.84 kg/m   HEMODYNAMICS:    VENTILATOR SETTINGS: Vent Mode: PRVC FiO2 (%):  [40 %] 40 % Set Rate:  [14 bmp] 14 bmp Vt Set:  [650 mL] 650 mL PEEP:  [5 cmH20] 5 cmH20 Plateau Pressure:  [15 cmH20-17 cmH20] 16 cmH20  INTAKE / OUTPUT: I/O last 3 completed shifts: In: 1321.3 [I.V.:446.3; IV Piggyback:875] Out: 2680 [Urine:2680]  PHYSICAL EXAMINATION: Physical Exam: Temp:  [97.1 F (36.2 C)-99.2 F (37.3 C)] 98.6 F (37 C)  (01/15 0800) Pulse Rate:  [55-71] 55 (01/15 0900) Resp:  [14-25] 14 (01/15 0900) BP: (130-184)/(84-110) 156/96 (01/15 0900) SpO2:  [96 %-100 %] 100 % (01/15 0900) FiO2 (%):  [40 %] 40 % (01/15 0819) Weight:  [91.3 kg (201 lb 4.5 oz)] 91.3 kg (201 lb 4.5 oz) (01/15 0410)  General:  Male of normal body habitus in NAD Neuro:  Sedated on vent. On WUA this AM had flicker to bilateral upper extremities and no movement of lowers. HEENT:  [place]/AT, No JVD noted, R pupil 3 mm, L pupil 7 mm Cardiovascular:  RRR, no MRG Lungs:  Clear Abdomen:  Soft, non-distended Musculoskeletal:  No acute deformity Skin:  Intact, MMM   LABS:  BMET  Recent Labs Lab 01/27/2016 0250 02/07/2016 0756 02/02/16 0313  NA 138  --  141  K 4.0  --  3.9  CL 105  --  104  CO2 19*  --  26  BUN 12  --  9  CREATININE 1.24 1.18 1.26*  GLUCOSE 254*  --  137*    Electrolytes  Recent Labs Lab 02/07/2016 0250 02/02/16 0313  CALCIUM 8.8* 9.2  MG  --  2.2  PHOS  --  3.6    CBC  Recent Labs Lab 02/07/2016 0250 01/28/2016 0756 02/02/16 0313  WBC 12.1* 9.1 9.8  HGB 15.8 14.9 15.5  HCT 47.3 44.4 45.8  PLT 163 149* 148*    Coag's No results for input(s): APTT, INR in the last 168 hours.  Sepsis Markers  Recent Labs Lab 01/30/2016 0756 02/13/2016 1046  LATICACIDVEN 2.2* 1.7    ABG  Recent Labs Lab 02/08/2016 0338  PHART 7.239*  PCO2ART 54.4*  PO2ART 260.0*    Liver Enzymes  Recent Labs Lab 01/27/2016 0250  AST 53*  ALT 34  ALKPHOS 83  BILITOT 0.9  ALBUMIN 3.3*    Cardiac Enzymes  Recent Labs Lab 02/18/2016 0250  TROPONINI 0.30*    Glucose  Recent Labs Lab 02/09/2016 1015 01/24/2016 1620 02/02/2016 2000 02/16/2016 2322 02/02/16 0336 02/02/16 0758  GLUCAP 197* 130* 135* 146* 141* 142*    Imaging Mr Brain Wo Contrast  Result Date: 01/29/2016 CLINICAL DATA:  Unresponsive, with LEFT gaze deviation. EXAM: MRI HEAD WITHOUT CONTRAST TECHNIQUE: Multiplanar, multiecho pulse sequences of the  brain and surrounding structures were obtained without intravenous contrast. COMPARISON:  Noncontrast CT head, CTA head neck, and CT perfusion earlier today. FINDINGS: Brain: Extensive acute infarction throughout much of the LEFT MCA territory, much of the LEFT ACA territory, and a portion of the LEFT PCA territory including the occipital lobe and thalamus. Subcortical white matter involvement RIGHT occipital lobe. BILATERAL inferior cerebellar involvement with acute infarction, LEFT greater than RIGHT. Marked brain swelling. Left-to-right shift of 4 mm. Early petechial hemorrhage developing in the LEFT posterior temporal and occipital region. Vascular: Flow void  is absent in the LEFT vertebral artery. Flow void is absent in the LEFT distal M1/proximal LEFT M2 branches. Tiny foci chronic hemorrhage BILATERAL medial thalami. Skull and upper cervical spine: Normal marrow signal. Sinuses/Orbits: Layering fluid both maxillary sinuses. Other: Layering fluid posterior nasopharynx. IMPRESSION: Multifocal areas of acute infarction as described, primarily LEFT hemisphere, but also involve RIGHT hemisphere subcortical white matter, and BILATERAL LEFT greater than RIGHT cerebellum. Areas of LEFT ACA, LEFT MCA, and LEFT PCA infarction are observed. Early petechial hemorrhagic transformation is noted. Apparent recent occlusion of the distal LEFT vertebral contributing to the LEFT inferior cerebellar stroke. LEFT MCA bifurcation thrombus/embolus. Electronically Signed   By: Elsie Stain M.D.   On: 03/02/16 16:43   Ct Cerebral Perfusion W Contrast  Result Date: March 02, 2016 CLINICAL DATA:  Stroke. Unresponsive. Last known while at 12 o'clock midnight, 9.5 hours ago. EXAM: CT PERFUSION BRAIN TECHNIQUE: Multiphase CT imaging of the brain was performed following IV bolus contrast injection. Subsequent parametric perfusion maps were calculated using RAPID software. CONTRAST:  50 mL Isovue 370 COMPARISON:  CT profusion earlier in  the same day. FINDINGS: CT Brain Perfusion Findings: CBF (<30%) Volume: Perfusion (Tmax>6.0s) volume: Mismatch Volume: Infarction Location:Left cerebral hemisphere. A large left hemispheric acute infarct is completed. The core infarct volume is 171 mL. There is no sparing of the ACA territory. PCA territory is spared. The ischemic volume includes areas within the right hemisphere. There is marked loss of gray-white differentiation throughout the left hemisphere. Left MCA occlusion is suggested. IMPRESSION: 1. Large left MCA territory completed infarct without sparing of the ACA territory. 2. Additional ischemic areas noted within the right hemisphere. 3. Distal left M1 occlusion. These results were called by telephone at the time of interpretation on March 02, 2016 at 9:52 am to Dr. Marvel Plan , who verbally acknowledged these results. Electronically Signed   By: Marin Roberts M.D.   On: 2016-03-02 09:53   Portable Chest Xray  Result Date: 02/02/2016 CLINICAL DATA:  66 year old male with history of acute respiratory failure with hypoxemia. History of stroke and seizures. EXAM: PORTABLE CHEST 1 VIEW COMPARISON:  Chest x-ray March 02, 2016. FINDINGS: An endotracheal tube is in place with tip 3.0 cm above the carina. A nasogastric tube is seen extending into the stomach, however, the tip of the nasogastric tube extends below the lower margin of the image. Lung volumes are low and there are extensive bibasilar opacities which may reflect areas of atelectasis and/or consolidation. Blunting of the costophrenic sulci bilaterally may reflect small bilateral pleural effusions. The extensive mid to upper lung airspace consolidation seen bilaterally on the prior study has essentially completely resolved. No pneumothorax. Pulmonary vasculature is normal. Heart size appears borderline enlarged. The patient is rotated to the left on today's exam, resulting in distortion of the mediastinal contours and reduced  diagnostic sensitivity and specificity for mediastinal pathology. IMPRESSION: 1. Support apparatus, as above. 2. Complete resolution of mid to upper lung airspace consolidation compared to the prior study, with new bilateral lower lung opacities which may reflect areas of atelectasis and/or airspace consolidation with superimposed small bilateral pleural effusions (left greater than right). Electronically Signed   By: Trudie Reed M.D.   On: 02/02/2016 08:03     STUDIES:  EEG 1/14 : Moderate to severely abnormal EEG and findings are suggestive of  Bilateral left more than right focal cerebral dysfunction suggesting underlying structural defect Epileptiform activity not seen. Echo 1/14: LVEF 35-40%, Severe LVH, diffuse HK, Grade 2 DD.  CTA head 1/14 : Occlusion of the left middle cerebral artery M1 segment. CT findings of acute left MCA territory infarct. MRI Brain 1/14 : Multifocal areas of acute infarction as described, primarily LEFT hemisphere, but also involve RIGHT hemisphere subcortical white matter, and BILATERAL LEFT greater than RIGHT cerebellum. Areas of LEFT ACA, LEFT MCA, and LEFT PCA infarction are observed. Early petechial hemorrhagic transformation is noted. Apparent recent occlusion of the distal LEFT vertebral contributing to the LEFT inferior cerebellar stroke. LEFT MCA bifurcation thrombus/embolus.  CULTURES: none  ANTIBIOTICS: none  SIGNIFICANT EVENTS:   LINES/TUBES: ETT 1/14 >> OGT 1/14 >>  DISCUSSION: Mr. Limes is a 89M presenting with altered mental status, hypertensive emergency, probable seizure (known history) and mild troponin leak with non-specific ECG changes. CTA head has just resulted and does suggest acute L MCA occlusion, though it was a poor quality study. Marked worsening in neuro exam overnight 1/14>15. With dilated L pupil now. Prognosis poor.  ASSESSMENT / PLAN:  PULMONARY A: Acute hypoxemic respiratory failure - likely 2/2 flash pulmonary  edema P:   Full vent support SBT if mental status improves VAP prevention bundle  CARDIOVASCULAR A:  Hypertensive emergency Mild troponin leak Chronic systolic/diastolic CHF Cocaine + P:  Telemetry PRN labetalol to keep SBP < 180 mmHg  NEUROLOGIC A:   Acute L MCA territory stroke - no TPA given 2/2 outside therapeutic window Likely herniation based on exam  P:   RASS goal: 0 to -1 Permissive HTN PRN labetalol to keep SBP < Continue Keppra Stroke service following, starting mannitol.  RENAL A:   Mixed metabolic and respiratory acidosis > resolved P:   Follow BMP Lactic cleared  GASTROINTESTINAL A:   No acute issues P:   Start TF SUP with pepcid  HEMATOLOGIC A:   Mild leukocytosis >improved P:  Trend CBC  INFECTIOUS A:   Leukocytosis - likely reactive - with no reported infectious symptoms > improved P:   Trend CBC No antibiotics for now  ENDOCRINE A:   Borderline diabtetes P:   CBGs, SSI as needed  FAMILY  - Updates: Neruology feels poor prog. Family updated by stroke service. Recommend DNR and even comfort care. Will follow and assist these discussions.  - Inter-disciplinary family meet or Palliative Care meeting due by:  1/20  APP Critical Care time: 35 mins  Joneen Roach, AGACNP-BC [facility] Pulmonology/Critical Care Pager [phone] or [phone]  02/02/2016 10:03 AM

## 2016-02-02 NOTE — Care Management Note (Signed)
Case Management Note  Patient Details  Name: Orion ModestOzzie L Penalver MRN: [id number] Date of Birth: 03/15/1950  Subjective/Objective:   Pt admitted on 02/02/2016 s/p Lt MCA, ACA and PCA infarct.  PTA, pt resided at home with spouse.                  Action/Plan: Per MD notes, pt likely not able to survive this event.  Family processing this information; have made pt a DNR.  Will follow/offer support.    Expected Discharge Date:                  Expected Discharge Plan:     In-House Referral:  Clinical Social Work  Discharge planning Services  CM Consult  Post Acute Care Choice:    Choice offered to:     DME Arranged:    DME Agency:     HH Arranged:    HH Agency:     Status of Service:  In process, will continue to follow  If discussed at Long Length of Stay Meetings, dates discussed:    Additional Comments:  Quintella BatonJulie W. Lars Jeziorski, RN, BSN  Trauma/Neuro ICU Case Manager [phone]

## 2016-02-03 DIAGNOSIS — E440 Moderate protein-calorie malnutrition: Secondary | ICD-10-CM

## 2016-02-03 LAB — GLUCOSE, CAPILLARY
GLUCOSE-CAPILLARY: 225 mg/dL — AB (ref 65–99)
Glucose-Capillary: 199 mg/dL — ABNORMAL HIGH (ref 65–99)
Glucose-Capillary: 188 mg/dL — ABNORMAL HIGH (ref 65–99)
Glucose-Capillary: 86 mg/dL (ref 65–99)

## 2016-02-03 LAB — BASIC METABOLIC PANEL
ANION GAP: 11 (ref 5–15)
BUN: 18 mg/dL (ref 6–20)
CALCIUM: 9.7 mg/dL (ref 8.9–10.3)
CO2: 28 mmol/L (ref 22–32)
Chloride: 110 mmol/L (ref 101–111)
Creatinine, Ser: 1.9 mg/dL — ABNORMAL HIGH (ref 0.61–1.24)
GFR calc Af Amer: 40 mL/min — ABNORMAL LOW (ref >60)
GFR, EST NON AFRICAN AMERICAN: 35 mL/min — AB (ref >60)
GLUCOSE: 154 mg/dL — AB (ref 65–99)
POTASSIUM: 3.9 mmol/L (ref 3.5–5.1)
SODIUM: 149 mmol/L — AB (ref 135–145)

## 2016-02-03 LAB — RPR, QUANT+TP ABS (REFLEX)
Rapid Plasma Reagin, Quant: 1:2 {titer} — ABNORMAL HIGH
TREPONEMA PALLIDUM AB: POSITIVE — AB

## 2016-02-03 LAB — OSMOLALITY
OSMOLALITY: 333 mosm/kg — AB (ref 275–295)
OSMOLALITY: 325 mosm/kg — AB (ref 275–295)
Osmolality: 328 mOsm/kg (ref 275–295)

## 2016-02-03 LAB — CBC
HEMATOCRIT: 45.6 % (ref 39.0–52.0)
Hemoglobin: 14.8 g/dL (ref 13.0–17.0)
MCH: 24.1 pg — ABNORMAL LOW (ref 26.0–34.0)
MCHC: 32.5 g/dL (ref 30.0–36.0)
MCV: 74.4 fL — AB (ref 78.0–100.0)
PLATELETS: 168 10*3/uL (ref 150–400)
RBC: 6.13 MIL/uL — AB (ref 4.22–5.81)
RDW: 16.7 % — ABNORMAL HIGH (ref 11.5–15.5)
WBC: 8.4 10*3/uL (ref 4.0–10.5)

## 2016-02-03 LAB — MAGNESIUM
MAGNESIUM: 2.5 mg/dL — AB (ref 1.7–2.4)
Magnesium: 2.7 mg/dL — ABNORMAL HIGH (ref 1.7–2.4)

## 2016-02-03 LAB — CULTURE, RESPIRATORY W GRAM STAIN

## 2016-02-03 LAB — CULTURE, RESPIRATORY: CULTURE: NORMAL

## 2016-02-03 LAB — PHOSPHORUS
PHOSPHORUS: 3.7 mg/dL (ref 2.5–4.6)
Phosphorus: 3.8 mg/dL (ref 2.5–4.6)

## 2016-02-03 LAB — RPR: RPR: REACTIVE — AB

## 2016-02-03 MED ORDER — SODIUM CHLORIDE 0.9 % IV SOLN
0.0000 mg/h | INTRAVENOUS | Status: DC
  Administered 2016-02-03: 10 mg/h via INTRAVENOUS
  Filled 2016-02-03 (×2): qty 10

## 2016-02-03 MED ORDER — MANNITOL 20 % IV SOLN
50.0000 g | Freq: Four times a day (QID) | Status: DC
  Filled 2016-02-03 (×2): qty 250

## 2016-02-03 MED ORDER — MORPHINE BOLUS VIA INFUSION
2.0000 mg | INTRAVENOUS | Status: DC | PRN
  Filled 2016-02-03: qty 4

## 2016-02-11 ENCOUNTER — Telehealth: Payer: Self-pay

## 2016-02-11 NOTE — Telephone Encounter (Signed)
On 02/11/2016 I received a death certificate from [employer] (original). The death certificate is for burial. The patient is a patient of [facility]. [facility] this pm for signature. On 02/13/16 I received the death certificate back from [facility]. I got the death certificate ready and called the funeral home to let them know the death certificate is ready for pickup.

## 2016-02-19 NOTE — Progress Notes (Signed)
CRITICAL VALUE ALERT  Critical value received:  Serum Osmol 333  Date of notification:  2016-12-25  Time of notification:  1006  Critical value read back:Yes.    Nurse who received alert:  Berniece Paponnie Tanmay Halteman, RN  MD notified (1st page):  Dr Roda ShuttersXu  Time of first page:  1008  MD notified (2nd page):  Time of second page:  Responding MD:  Dr Roda ShuttersXu  Time MD responded:  [phone]

## 2016-02-19 NOTE — Procedures (Signed)
Extubation Procedure Note  Patient Details:   Name: Matthew Thornton DOB: 01/27/1950 MRN: [id number]  Per Dr Jamison [facility] to terminal extubate pt to RA. RN at the pts bedside. RT extubated pt to RA per MD orders. Family at the pts bedside. Pt comfortable.       [name], [name] 02/14/2016, 5:58 PM

## 2016-02-19 NOTE — Progress Notes (Signed)
CRITICAL VALUE ALERT  Critical value received:  Osmolality - 328  Date of notification: 06-06-16  Time of notification:  0504  Critical value read back: yes  Nurse who received alert:  Antonieta IbaSavannah Alazne Quant, RN  MD notified (1st page):  CCM via eLink  Time of first page:  0509  MD notified (2nd page):  Time of second page:  Responding MD:  CCM via eLink  Time MD responded:  [phone]  Informed CCM as critical care MD was who placed the order for mannitol gtt. No new orders given to RN r/t critical value. In-coming RN may want to notify or ask MD prior to administering next dose.  Will continue to monitor closely.  Francia GreavesSavannah R Alexxa Sabet, RN

## 2016-02-19 NOTE — Progress Notes (Signed)
Pt was asystole on monitor. No heart sounds auscultated, or pulses palpated by Loraine Griponstance Dupont, RN and [facility] Gappa, RN. Family at bedside. Emotional support provided by RN and chaplain. Attending physician, CDS, and medical examiner notified. Time of death [id number]. Pt transported to morgue via nursing staff. Delfino Lovettichie Aysha Livecchi, RN, BSN April 30, 2016 7:12 PM

## 2016-02-19 NOTE — Progress Notes (Signed)
PULMONARY / CRITICAL CARE MEDICINE   Name: KAIDAN HARPSTER MRN: [id number] DOB: 05-19-1950    ADMISSION DATE:  02-08-16 CONSULTATION DATE:    REFERRING MD:  EDP  CHIEF COMPLAINT:  Altered mental status  HISTORY OF PRESENT ILLNESS:   Mr. Lottman is a 78M with PMH significant for mixed systolic and diastolic dysfunction (EF 45-50% in 2014), hepatitis C, hypertension, prior lacunar infarcts, hx seizures, tobacco abuse and cocaine abuse, who presents to the ED from home via EMS after his wife noted him to be unresponsive with sonorous respirations requiring intubation in ED. Was also wheezing and hypoxic. He was admitted to Neuro ICU with acute left MCA CVA in the setting of  R MI occlusion (embolic pattern). Also presented with hypertensive emergency secondary to "heavy" cocaine abuse.  PAST MEDICAL HISTORY :  He  has a past medical history of Cardiomyopathy; Cocaine abuse; Hepatitis C; Hypertension; Multiple lacunar infarcts (HCC); Pre-diabetes; Seizures (HCC); and Tobacco abuse.  PAST SURGICAL HISTORY: He  has a past surgical history that includes Back surgery.  Allergies  Allergen Reactions  . Morphine And Related Other (See Comments)    Hiccups  . Clotrimazole Rash and Other (See Comments)    Patient developed WELTS on his skin because of this    No current facility-administered medications on file prior to encounter.    Current Outpatient Prescriptions on File Prior to Encounter  Medication Sig  . albuterol (PROVENTIL HFA;VENTOLIN HFA) 108 (90 BASE) MCG/ACT inhaler Inhale 2 puffs into the lungs every 6 (six) hours as needed for shortness of breath.   Marland Kitchen atorvastatin (LIPITOR) 40 MG tablet Take 1 tablet (40 mg total) by mouth daily at 6 PM.  . gabapentin (NEURONTIN) 600 MG tablet Take 600 mg by mouth 4 (four) times daily.    Marland Kitchen glipiZIDE (GLUCOTROL) 2.5 mg TABS tablet Take 0.5 tablets (2.5 mg total) by mouth daily before breakfast.  . lisinopril (PRINIVIL,ZESTRIL) 40 MG tablet  Take 1 tablet (40 mg total) by mouth daily.  . naproxen (NAPROSYN) 500 MG tablet Take 250 mg by mouth 2 (two) times daily as needed (for pain).   . ranitidine (ZANTAC) 150 MG tablet Take 150 mg by mouth 2 (two) times daily.    Marland Kitchen amLODipine (NORVASC) 5 MG tablet Take 1 tablet (5 mg total) by mouth daily. (Patient not taking: Reported on 2016-02-08)  . amoxicillin (AMOXIL) 500 MG capsule Take 1 capsule (500 mg total) by mouth 3 (three) times daily. (Patient not taking: Reported on 08-Feb-2016)  . aspirin 325 MG tablet Take 1 tablet (325 mg total) by mouth daily. (Patient not taking: Reported on 02-08-16)  . HYDROcodone-acetaminophen (NORCO) 5-325 MG per tablet Take 1 tablet by mouth every 6 (six) hours as needed for moderate pain. (Patient not taking: Reported on 2016-02-08)  . valACYclovir (VALTREX) 500 MG tablet Take 500 mg by mouth 2 (two) times daily.    FAMILY HISTORY:  His has no family status information on file.    SOCIAL HISTORY: He  reports that he has been smoking Cigarettes.  He has been smoking about 0.25 packs per day. He has never used smokeless tobacco. He reports that he drinks alcohol. He reports that he uses drugs, including Marijuana.  REVIEW OF SYSTEMS:   Unable to obtain 2/2 intubated state  SUBJECTIVE:  Neuro exam much worse this morning per neuro note. Fears herniation and brain death are imminent.    VITAL SIGNS: BP (!) 92/59   Pulse (!) 59  Temp 98.8 F (37.1 C) (Axillary)   Resp 14   Ht 6\' 2"  (1.88 m)   Wt 89.9 kg (198 lb 3.1 oz)   SpO2 95%   BMI 25.45 kg/m   HEMODYNAMICS:    VENTILATOR SETTINGS: Vent Mode: PRVC FiO2 (%):  [30 %-40 %] 30 % Set Rate:  [14 bmp] 14 bmp Vt Set:  [650 mL] 650 mL PEEP:  [5 cmH20] 5 cmH20 Plateau Pressure:  [17 cmH20-20 cmH20] 20 cmH20  INTAKE / OUTPUT: I/O last 3 completed shifts: In: 3910 [I.V.:671.7; NG/GT:910.8; IV Piggyback:2327.5] Out: 4565 [Urine:4565]  PHYSICAL EXAMINATION: Physical Exam: Temp:  [95.2 F  (35.1 C)-101.6 F (38.7 C)] 98.8 F (37.1 C) (01/16 1200) Pulse Rate:  [39-68] 59 (01/16 1200) Resp:  [13-21] 14 (01/16 1200) BP: (92-221)/(59-100) 92/59 (01/16 1200) SpO2:  [95 %-100 %] 95 % (01/16 1200) FiO2 (%):  [30 %-40 %] 30 % (01/16 1100) Weight:  [89.9 kg (198 lb 3.1 oz)] 89.9 kg (198 lb 3.1 oz) (01/16 0615)  General:  Male of normal body habitus on vent Neuro:  Comatose off sedation. Pupils 8mm and unresponsive to light. Cough/gag no longer present HEENT:  [place]/AT, No JVD noted, PERRL Cardiovascular:  RRR, no MRG Lungs:  Clear bilateral breath sounds Abdomen:  Soft, non-distended Musculoskeletal:  No acute deformity Skin:  Intact, MMM    LABS:  BMET  Recent Labs Lab 01/21/2016 0250 02/06/2016 0756 02/02/16 0313  NA 138  --  141  K 4.0  --  3.9  CL 105  --  104  CO2 19*  --  26  BUN 12  --  9  CREATININE 1.24 1.18 1.26*  GLUCOSE 254*  --  137*    Electrolytes  Recent Labs Lab 01/24/2016 0250  02/02/16 0313 02/02/16 1046 02/02/16 1418 02-11-2016 0403  CALCIUM 8.8*  --  9.2  --   --   --   MG  --   < > 2.2 2.1 2.0 2.5*  PHOS  --   < > 3.6 3.9 4.2 3.7  < > = values in this interval not displayed.  CBC  Recent Labs Lab 02/05/2016 0250 01/21/2016 0756 02/02/16 0313  WBC 12.1* 9.1 9.8  HGB 15.8 14.9 15.5  HCT 47.3 44.4 45.8  PLT 163 149* 148*    Coag's No results for input(s): APTT, INR in the last 168 hours.  Sepsis Markers  Recent Labs Lab 02/06/2016 0756 02/13/2016 1046  LATICACIDVEN 2.2* 1.7    ABG  Recent Labs Lab 01/27/2016 0338  PHART 7.239*  PCO2ART 54.4*  PO2ART 260.0*    Liver Enzymes  Recent Labs Lab 01/31/2016 0250  AST 53*  ALT 34  ALKPHOS 83  BILITOT 0.9  ALBUMIN 3.3*    Cardiac Enzymes  Recent Labs Lab 02/12/2016 0250  TROPONINI 0.30*    Glucose  Recent Labs Lab 02/02/16 1554 02/02/16 1946 02/02/16 2310 2016-02-11 0316 11-Feb-2016 0751 02/11/2016 1129  GLUCAP 204* 181* 132* 225* 199* 188*    Imaging No  results found.   STUDIES:  EEG 1/14 : Moderate to severely abnormal EEG and findings are suggestive of  Bilateral left more than right focal cerebral dysfunction suggesting underlying structural defect Epileptiform activity not seen. Echo 1/14: LVEF 35-40%, Severe LVH, diffuse HK, Grade 2 DD.  CTA head 1/14 : Occlusion of the left middle cerebral artery M1 segment. CT findings of acute left MCA territory infarct. MRI Brain 1/14 : Multifocal areas of acute infarction as described, primarily LEFT hemisphere,  but also involve RIGHT hemisphere subcortical white matter, and BILATERAL LEFT greater than RIGHT cerebellum. Areas of LEFT ACA, LEFT MCA, and LEFT PCA infarction are observed. Early petechial hemorrhagic transformation is noted. Apparent recent occlusion of the distal LEFT vertebral contributing to the LEFT inferior cerebellar stroke. LEFT MCA bifurcation thrombus/embolus.  CULTURES: none  ANTIBIOTICS: none  SIGNIFICANT EVENTS:   LINES/TUBES: ETT 1/14 >> OGT 1/14 >>  DISCUSSION: [name] is a [age]M presenting with altered mental status, hypertensive emergency, probable seizure (known history) and mild troponin leak with non-specific ECG changes. CTA head has just resulted and does suggest acute L MCA occlusion, though it was a poor quality study. Neuro exam continues to worsen and prognosis is very poor at this point.  ASSESSMENT / PLAN:  NEUROLOGIC A:   Acute L MCA territory stroke - no TPA given 2/2 outside therapeutic window Likely herniation based on exam  P:   RASS goal: 0 to -1 Permissive HTN PRN labetalol to keep SBP < 180mmHg Continue Keppra Stroke service following, starting mannitol.  PULMONARY A: Acute hypoxemic respiratory failure - likely 2/2 flash pulmonary edema > oxygenation improved  P:   Full vent support VAP prevention bundle  CARDIOVASCULAR A:  Hypertensive emergency Mild troponin leak Chronic systolic/diastolic CHF Cocaine + P:   Telemetry PRN labetalol to keep SBP < 180 mmHg  RENAL A:   Mixed metabolic and respiratory acidosis > resolved P:   BMP in AM  GASTROINTESTINAL A:   No acute issues P:   Continue tube feeding SUP with pepcid  HEMATOLOGIC A:   Mild leukocytosis >improved P:  Trend CBC  INFECTIOUS A:   Leukocytosis - likely reactive - with no reported infectious symptoms > improved P:   Trend WBC and fever curve  ENDOCRINE A:   Borderline diabtetes P:   CBG monitoring and SSI  FAMILY  - Updates: Neurology feels brain death/herniation are imminent. Discussed option of extubation and comfort care with family. They are waiting for other family members to get here before they decide. Still DNR.  - Inter-disciplinary family meet or Palliative Care meeting due by:  1/20  Joneen RoachPaul Hoffman, AGACNP-BC [facility] Pager [phone] or [phone]  02/16/2016 1:28 PM

## 2016-02-19 NOTE — Progress Notes (Addendum)
SBT trial done- pt had no effort. Pt went apneic. RT placed pt back on full support same settings. RN notified. Decreased FIO2 to 30% sats 100%

## 2016-02-19 NOTE — Progress Notes (Signed)
Called Dr. Roda ShuttersXu to notify in patient's change in neuro status.  Pt no longer has cough or gag reflex.  He no longer responds to painful stimulus and pupils are dilated and fixed bilaterally. Dr. Roda ShuttersXu ordered labs and will do cold caloric testing and consider further testing. Will continue to monitor patient status. Shepard Keltz C 12:07 PM 01/25/2016

## 2016-02-19 NOTE — Discharge Summary (Signed)
Death Note: For a complete accounting of the patient's history and physical exam on presentation please refer to the H&P dictated on 02/24/16. Patient had a known history of combined systolic and diastolic congestive heart failure, hepatitis C viral infection, essential hypertension, history of lacunar infarcts, history of seizures, tobacco abuse, & cocaine use. Per report patient was noted to be unresponsive with sonorous respirations. Upon EMS arrival patient had hypertensive emergency with a blood pressure of 290/150 and was saturating in the 70s on room air. Patient was intubated upon arrival in the emergency department. During intubation patient reportedly moved his left arm and had a leftward gaze deviation initial report without contrast of CT of head showed no acute findings. Of note the patient had significant chest congestion for approximately one week and [facility] and imaging of his chest without obvious abnormality. Patient prescribed azithromycin, Tessalon Perles, and guaifenesin. Patient had been started on a new medication for his hepatitis C on January 10 but had no discernible side effects. Patient reportedly had a seizure one week prior to presentation. During the workup patient was subsequently found to have an acute left middle cerebral artery occlusion with a poor quality study. EEG was performed on 1/14 and was found to be severely abnormal with left greater than right focal cerebral dysfunction suggesting an underlying structural defect but without any epileptiform activity. MRI of the brain performed on 1/14 showed multifocal areas of acute infarction primarily within the left hemisphere but also involving the right hemispheric subcortical white matter and bilateral left greater than right cerebellum. Early petechial hemorrhage transformation was also noted. On admission a urine drug screen was performed showing the patient was cocaine positive. The patient's  neurological status and physical exam continued to deteriorate. Patient developed bilateral fixed and dilated pupils as well as absent brainstem reflexes. He developed subsequent hypertension followed by hypotension and lactic spontaneous respirations on ventilator. All of these findings were concerning and consistent with probable brainstem herniation. This was communicated to the family by not only myself but also neurology. Patient was reevaluated by neurology on 1/16 in the late afternoon having completely no brainstem reflexes on exam. Cold caloric test was performed at bedside without any nystagmus. The patient's clinical picture was consistent with brain death. Further discussions between family members proceeded and ultimately they decided to transition to full comfort care. Patient was extubated to room air on 02/09/2016 and appeared comfortable. Family were at bedside. Hospital chaplain was available for spiritual support. Patient was pronounced dead at 6:10 PM on 01/20/2016 with asystole on telemetry monitoring and without any heart sounds on auscultation or palpable pulse.  Diagnoses at Death: 1. Brain death 2. Acute hypoxic respiratory failure 3. Acute bilateral strokes 4. Hypertensive emergency 5. Polysubstance abuse including cocaine and tobacco 6. Chronic systolic and diastolic congestive heart failure 7. Seizure disorder 8. History of hepatitis C viral infection 9. History of prior lacunar cerebral infarcts 10. Borderline diabetes mellitus

## 2016-02-19 NOTE — Progress Notes (Signed)
Visited with patient to support patient wife and sister at bedside.  Patient is nearing end of life and DX as brain dead. Per wife request I prayed with patient and family as family is preparing to withdraw life support. Family is accepting of DX and is coping well.  Provided emotional and spiritual support, ministry of presence,empathetic listening and grief support. Will pass on to on call chaplain for follow up as needed.   01/27/2016 1600  Clinical Encounter Type  Visited With Patient and family together;Health care provider  Visit Type Initial;Spiritual support;Patient actively dying  Referral From Nurse  Spiritual Encounters  Spiritual Needs Prayer;Emotional;Grief support  Stress Factors  Family Stress Factors Loss  Venida JarvisWatlington, Cymone Yeske, Chaplain,pager [phone]

## 2016-02-19 NOTE — Progress Notes (Addendum)
SLP Cancellation Note  Patient Details Name: Matthew Thornton MRN: [id number] DOB: 02/03/1950   Cancelled treatment:       Reason Eval/Treat Not Completed: Patient not medically ready. Still requiring full vent support. SLP to s/oo at this time. Please reorder if we can be of assistance.    Maxcine Hamaiewonsky, Kadian Barcellos 07-14-2016, 10:09 AM  Maxcine HamLaura Paiewonsky, M.A. CCC-SLP [phone]

## 2016-02-19 NOTE — Plan of Care (Signed)
Called by nurse that pt has lost all brainstem reflexes. Came to see pt, sister in room and later wife arrived. Pt pupil left 5mm, right 6mm, no papillary reflex. No corneals, no doll's eyes, no gag or cough. Not breathing over the vent. BP 71/53. HR 66. Na 149, glucose 154, Cre 1.9, Osm 333. Hb 14.8 and WBC 8.4, platelet 168. Has been off propofol since 7:41am. Did cold caloric test no nystagmus seen. Clinical picture consistent with brain death.   Discussed with wife and sister, so far they declined apnea test at this time. They would like to have further discussion among themselves.   Marvel PlanJindong Shakedra Beam, MD PhD Stroke Neurology 09-06-16 4:07 PM

## 2016-02-19 NOTE — Progress Notes (Signed)
eLink Physician-Brief Progress Note Patient Name: Matthew ModestOzzie L Thornton DOB: 05/29/1950 MRN: [id number]   Date of Service  01/26/2016  HPI/Events of Note  Transition to comfort care Not organ donation candidate  eICU Interventions  Withdrawal orders placed     Intervention Category Intermediate Interventions: Communication with other healthcare providers and/or family  ALVA,RAKESH V. 02/18/2016, 4:56 PM

## 2016-02-19 NOTE — Progress Notes (Signed)
Informed on-call neurology MD concerning critical value - osmolality 328. MD stated to ensure getting results for osmolality prior to administering mannitol and have in-coming RN discuss with rounding neuro MD about next dose scheduled (whether to hold or administer).   MD states he typically holds dose of mannitol if osmolality >320 - re-evaluate with rounding MD during day shift.   Francia GreavesSavannah R Dosha Broshears, RN

## 2016-02-19 NOTE — Progress Notes (Signed)
At 2200, pt's pupils had both become a size 5, fixed and dilated - a significant change from the start of the shift. Gag was absent, cough - weak, and one corneal reflex remained intact. Felt pt's neuro status was declining - informed on-call neurology MD of pt's current status. MD stated there were no other interventions that could be done at this time and to continue monitoring.   Pt has waxed/waned throughout the night. Upon restarting propofol gtt and administering scheduled mannitol infusion - R pupil constricted back down to 2 (charting reflects the change).   Continuing to monitor pt closely.  Francia GreavesSavannah R Linell Shawn, RN

## 2016-02-19 NOTE — Progress Notes (Signed)
  Wasted 240 ml morphine in sink.  Vianne Bullsich [place], RN witnessed. Blythe Veach C 6:59 PM

## 2016-02-19 NOTE — Progress Notes (Signed)
STROKE TEAM PROGRESS NOTE   SUBJECTIVE (INTERVAL HISTORY) Wife and sister are at the bedside.  He still intubated but off sedation. However, he is in coma, with b/l pupil ballooning, no corneal, hypothermia on warming blanket. Still has weak gag and mild withdraw on pain stimulation.    OBJECTIVE Temp:  [95.2 F (35.1 C)-101.6 F (38.7 C)] 95.2 F (35.1 C) (01/16 0800) Pulse Rate:  [39-68] 44 (01/16 1000) Cardiac Rhythm: Sinus bradycardia (01/16 0800) Resp:  [13-21] 14 (01/16 1000) BP: (101-198)/(66-102) 185/86 (01/16 1000) SpO2:  [95 %-100 %] 97 % (01/16 1000) FiO2 (%):  [30 %-40 %] 30 % (01/16 1000) Weight:  [198 lb 3.1 oz (89.9 kg)] 198 lb 3.1 oz (89.9 kg) (01/16 0615)  CBC:   Recent Labs Lab Feb 12, 2016 0250 2016-02-12 0756 02/02/16 0313  WBC 12.1* 9.1 9.8  NEUTROABS 5.0  --  7.2  HGB 15.8 14.9 15.5  HCT 47.3 44.4 45.8  MCV 74.5* 73.8* 73.8*  PLT 163 149* 148*    Basic Metabolic Panel:   Recent Labs Lab February 12, 2016 0250 02-12-2016 0756 02/02/16 0313  02/02/16 1418 02/13/2016 0403  NA 138  --  141  --   --   --   K 4.0  --  3.9  --   --   --   CL 105  --  104  --   --   --   CO2 19*  --  26  --   --   --   GLUCOSE 254*  --  137*  --   --   --   BUN 12  --  9  --   --   --   CREATININE 1.24 1.18 1.26*  --   --   --   CALCIUM 8.8*  --  9.2  --   --   --   MG  --   --  2.2  < > 2.0 2.5*  PHOS  --   --  3.6  < > 4.2 3.7  < > = values in this interval not displayed.  Lipid Panel:     Component Value Date/Time   CHOL 162 02/12/16 1046   TRIG 117 02/12/16 1046   TRIG 119 02-12-16 1046   HDL 38 (L) 2016/02/12 1046   CHOLHDL 4.3 12-Feb-2016 1046   VLDL 23 Feb 12, 2016 1046   LDLCALC 101 (H) Feb 12, 2016 1046   HgbA1c:  Lab Results  Component Value Date   HGBA1C 6.9 (H) 12-Feb-2016   Urine Drug Screen:     Component Value Date/Time   LABOPIA NONE DETECTED 2016-02-12 0615   COCAINSCRNUR POSITIVE (A) Feb 12, 2016 0615   COCAINSCRNUR NEGATIVE 11/15/2007 1346    LABBENZ NONE DETECTED 12-Feb-2016 0615   LABBENZ NEGATIVE 11/15/2007 1346   AMPHETMU NONE DETECTED 02/12/16 0615   THCU NONE DETECTED February 12, 2016 0615   LABBARB NONE DETECTED 12-Feb-2016 0615      IMAGING I have personally reviewed the radiological images below and agree with the radiology interpretations.  Ct Angio Head W Or Wo Contrast 12-Feb-2016 1. 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  2. CT findings of acute left MCA territory infarct.  3. Markedly limited examination due to poor contrast bolus and inaccurate field of view on the perfusion portion of the study.  Non-diagnostic perfusion scan.   Ct Head Wo Contrast 2016/02/12 1. No acute intracranial pathology seen on CT.  2. Mild cortical volume loss and scattered small vessel ischemic microangiopathy.  3. Chronic lacunar  infarct at the corona radiata bilaterally, and at the basal ganglia.  4. Mucosal thickening at the right maxillary sinus.   Ct Cerebral Perfusion W Contrast Repeat Aug 10, 2016 1. Large left MCA territory completed infarct without sparing of the ACA territory.  2. Additional ischemic areas noted within the right hemisphere.  3. Distal left M1 occlusion.   Ct Cerebral Perfusion W Contrast Aug 10, 2016 Non-diagnostic perfusion scan.   Dg Chest Port 1 View Aug 10, 2016 1. Endotracheal tube seen ending 3 cm above the carina.  2. Enteric tube noted ending about the body of the stomach.  3. Vascular congestion and mild cardiomegaly. Hazy bilateral airspace opacification   may reflect bilateral pneumonia or pulmonary edema.   Mr Brain Wo Contrast Aug 10, 2016 IMPRESSION: Multifocal areas of acute infarction as described, primarily LEFT hemisphere, but also involve RIGHT hemisphere subcortical white matter, and BILATERAL LEFT greater than RIGHT cerebellum. Areas of LEFT ACA, LEFT MCA, and LEFT PCA infarction are observed. Early petechial hemorrhagic transformation is noted. Apparent recent occlusion of  the distal LEFT vertebral contributing to the LEFT inferior cerebellar stroke. LEFT MCA bifurcation thrombus/embolus.   Portable Chest Xray 02/02/2016 IMPRESSION: 1. Support apparatus, as above. 2. Complete resolution of mid to upper lung airspace consolidation compared to the prior study, with new bilateral lower lung opacities which may reflect areas of atelectasis and/or airspace consolidation with superimposed small bilateral pleural effusions (left greater than right).   TTE Normal LV size with severe LV hypertrophy. EF 35-40%, diffuse   hypokinesis. Somewhat speckled appearance of LV myocardium may   suggest cardiac amyloidosis. Moderate diastolic dysfunction.   Normal RV size with mildly decreased systolic function. Mild   aortic insufficiency. Biatrial enlargement.   PHYSICAL EXAM  Temp:  [95.2 F (35.1 C)-101.6 F (38.7 C)] 95.2 F (35.1 C) (01/16 0800) Pulse Rate:  [39-68] 44 (01/16 1000) Resp:  [13-21] 14 (01/16 1000) BP: (101-198)/(66-102) 185/86 (01/16 1000) SpO2:  [95 %-100 %] 97 % (01/16 1000) FiO2 (%):  [30 %-40 %] 30 % (01/16 1000) Weight:  [198 lb 3.1 oz (89.9 kg)] 198 lb 3.1 oz (89.9 kg) (01/16 0615)  General - Well nourished, well developed, intubated off sedation.  Ophthalmologic - Fundi not visualized .  Cardiovascular - Regular rate and rhythm, but bradycardia at low 40s.  Neuro - intubated off sedation. Not respond to voice, eyes closed. Eye middle position, left pupil 5mm, right pupil 6mm, fixed not reactive to light, no corneal or doll's eyes. weak gag, not breathing over the vent. On pain stimulation, minimal twitching movement of all extremities L>R, DTR diminished, babinski positive bilaterally. Sensation, coordination and gait not tested.   ASSESSMENT/PLAN Matthew Thornton is a 66 y.o. male with history of previous strokes, a seizure disorder (on Keppra), tobacco abuse, prediabetes, multiple lacunar infarcts, hypertension, hepatitis C, substance  abuse, recent respiratory infection, and cardiomyopathy presenting with unresponsiveness, left gaze preference, and right hemiplegia. He did not receive IV t-PA due to late presentation.  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 Pt cocaine positive.  Resultant  Deep coma, brain herniation and respiratory failure  MRI - large left MCA, ACA and PCA infarcts, left cerebellar, and punctate right cerebellar and right MCA infarcts  CTA head - left M1 occlusion  CTP - left MCA / ACA large infarct core, with non significant penumbra  CUS cancelled  TTE EF 35-40%  LE venous doppler cancelled  EEG - generalized delta slowing, L>R, no seizure  LDL - 101   HgbA1c - 6.9   VTE prophylaxis - subcutaneous heparin Diet NPO time specified  aspirin 325 mg daily prior to admission, now on aspirin 325 mg daily.   Disposition: Pending - wife and sister are asking pt daughter come to visit and [facility] - they are deciding on comfort care.   Uncal herniation  Significant left pan hemisphere infarct  bilateral pupil dilated and fixed, no corneal  Not surgical candidate due to grave prognosis  Not survivable at this time  Wife and sister are deciding on comfort care  Code status DNR  On mannitol at meantime, instead of 3% saline which requires central line and frequent sodium check.   Osmo 333, hold mannitol if Osm > 320  Hx of seizure  Nocturnal seizure at home  Last seizure one week ago  On home keppra 1g bid  Was loaded with keppra in ED  EEG generalized delta slowing, L>R, no seizure  Hx of stroke  3 years ago as per wife  No residue  CT head showed b/l BG/CR old lacunar infarcts, R>L  Cocaine abuse  UDS positive for cocaine  Wife admitted he take "quite a lot" cocaine 2 days ago  Hx of cocaine abuse in the past  CHF / aspiration  CXR - may reflect bilateral pneumonia or pulmonary edema.   IV Unasyn, IV  Zithromax, and IV Rocephin.  TTE EF 35-40%  Hypertension  Stable Permissive hypertension (OK if < 220/120) but gradually normalize in 5-7 days  Hyperlipidemia  Home meds:  Lipitor 40 mg daily   LDL 101, goal < 70  Resumed lipitor 40  Diabetes  HgbA1c 6.9, goal < 7.0  Under controlled  Still hyperglycemia  SSI  Tobacco abuse  Current smoker  Other Stroke Risk Factors  Advanced age  ETOH use, will be advised to abstain from alcohol.  Overweight, Body mass index is 25.45 kg/m., recommend weight loss, diet and exercise as appropriate   Other Active Problems  Hepatitis C history - HIV, RPR pending   Hospital day # 2  This patient is critically ill due to left brain large infarct, possible seizure, CHF, respiratory failure and at significant risk of neurological worsening, death form recurrent stroke, hemorrhagic transformation, status epilepticus, heart failure. This patient's care requires constant monitoring of vital signs, hemodynamics, respiratory and cardiac monitoring, review of multiple databases, neurological assessment, discussion with family, other specialists and medical decision making of high complexity. I spent 45 minutes of neurocritical care time in the care of this patient.   I had long discussion with wife and sister at bedside. Pt currently has worsening uncal herniation, not survivable and impending brain death or cardiac death. Family still discuss among themselves and with [facility]. Will keep life support now and DNR status.    Marvel Plan, MD PhD Stroke Neurology 02/17/2016 10:23 AM    To contact Stroke Continuity provider, please refer to [email or web address]. After hours, contact General Neurology

## 2016-02-19 DEATH — deceased

## 2017-10-24 IMAGING — CT CT HEAD W/O CM
3 series · 15 of 47 positions shown, 18 images · non-contrast
Comparison: CT of the head performed 04/03/2015

CLINICAL DATA: Acute onset of altered level of consciousness.
Initial encounter.

EXAM:
CT HEAD WITHOUT CONTRAST
TECHNIQUE: Contiguous axial images were obtained from the base of the skull
through the vertex without intravenous contrast.

[Series 2: head 5.0 h30s · axial · 0.45mm/px · z∈[-119,+21]mm · 9 of 34 slices shown, 12 images]
[im 3/34  brain]
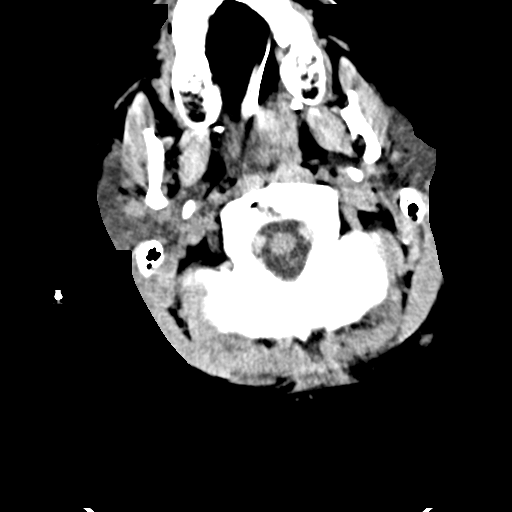
[im 3/34  bone]
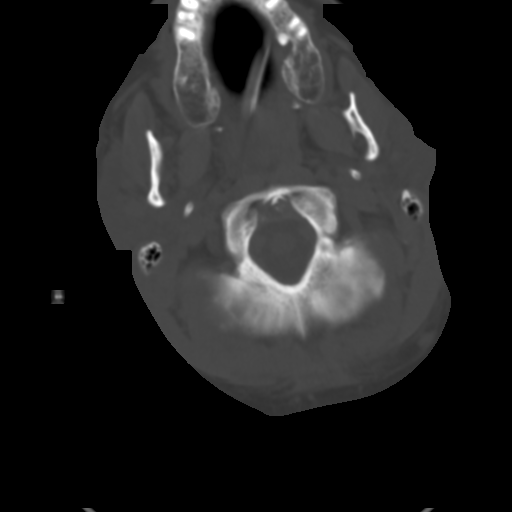
[im 6/34  brain]
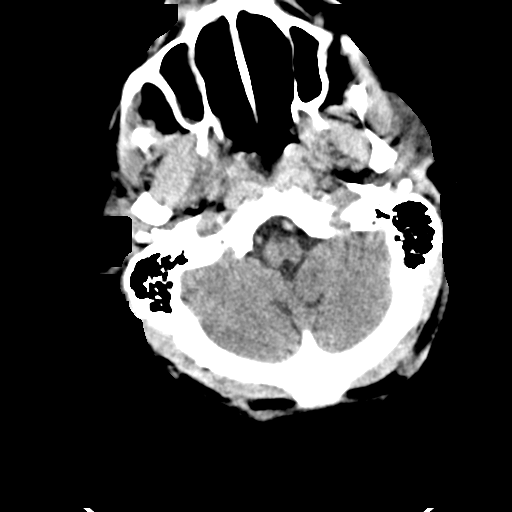
[im 10/34  brain]
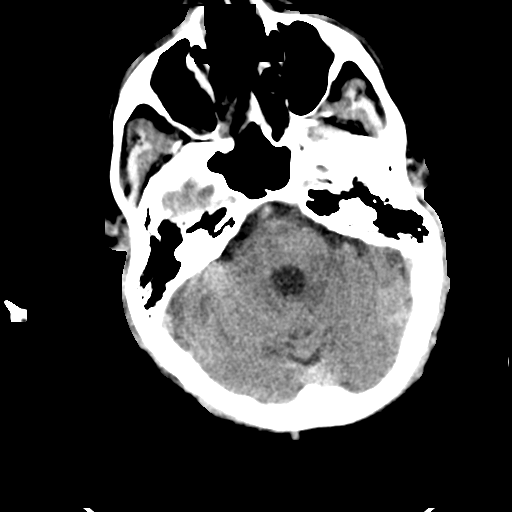
[im 13/34  brain]
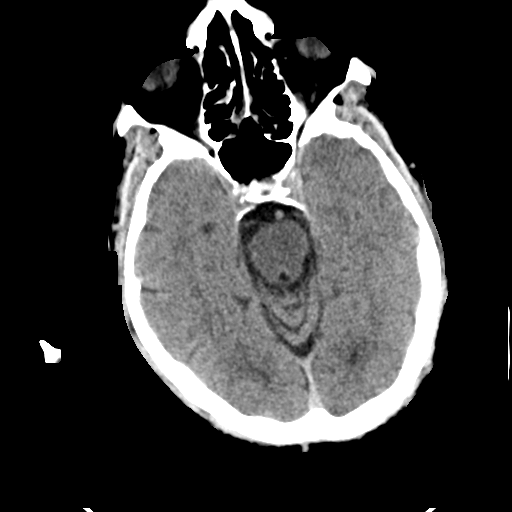
[im 18/34  brain]
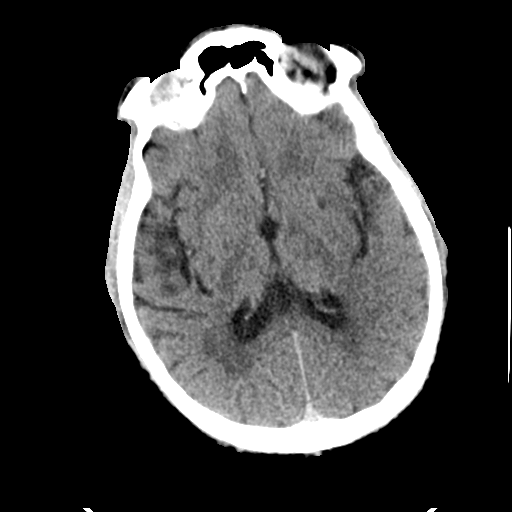
[im 18/34  bone]
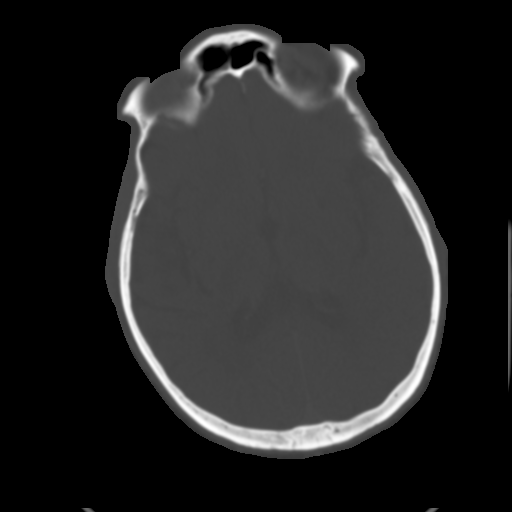
[im 21/34  brain]
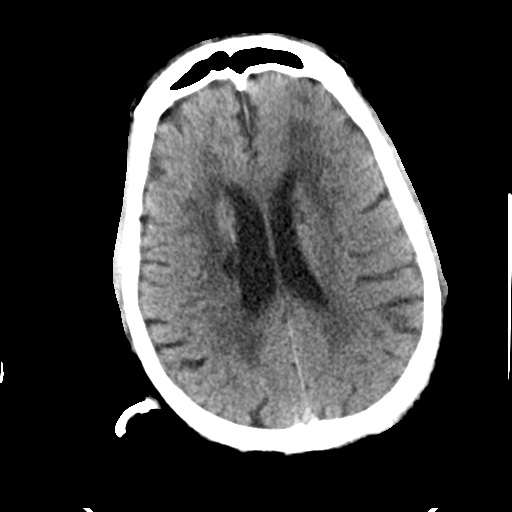
[im 24/34  brain]
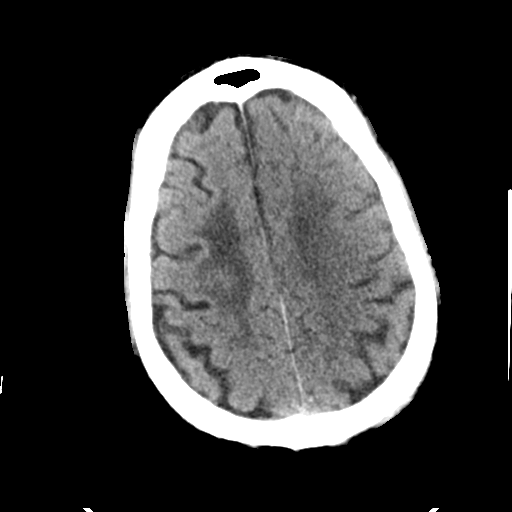
[im 28/34  brain]
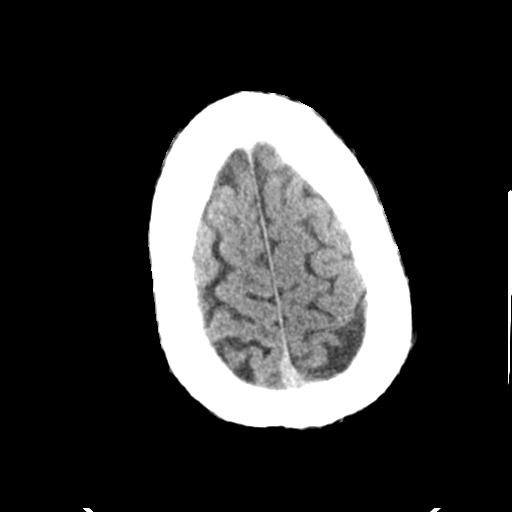
[im 31/34  brain]
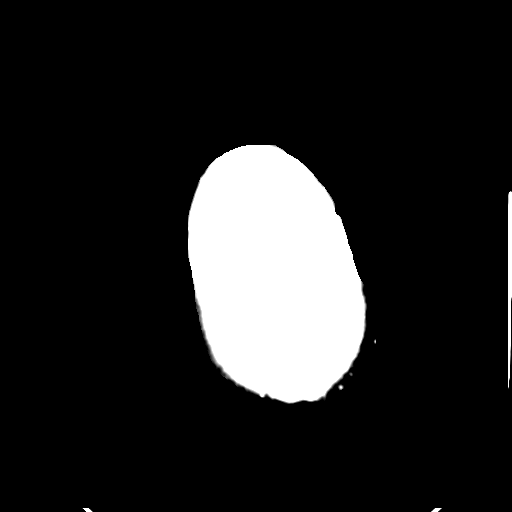
[im 31/34  bone]
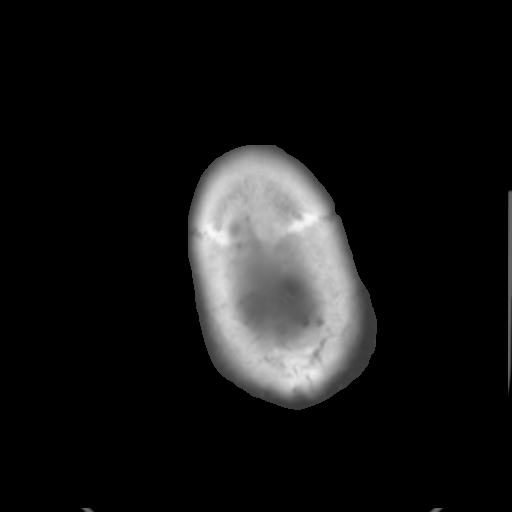

[Series 4: head 3.0 mpr cor · coronal · 0.34mm/px · 3 of 75 slices shown]
[im 25/75  brain]
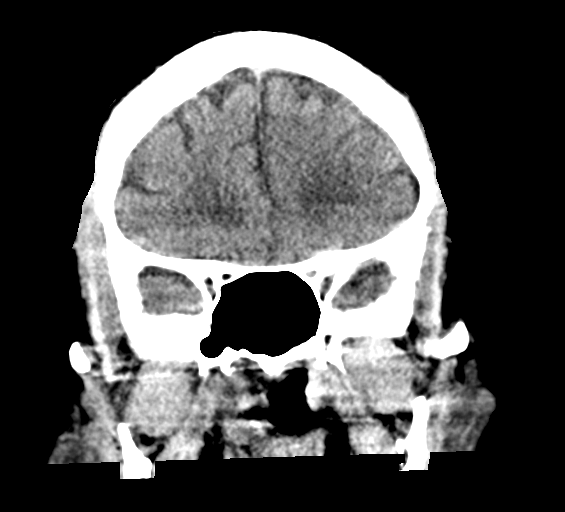
[im 33/75  brain]
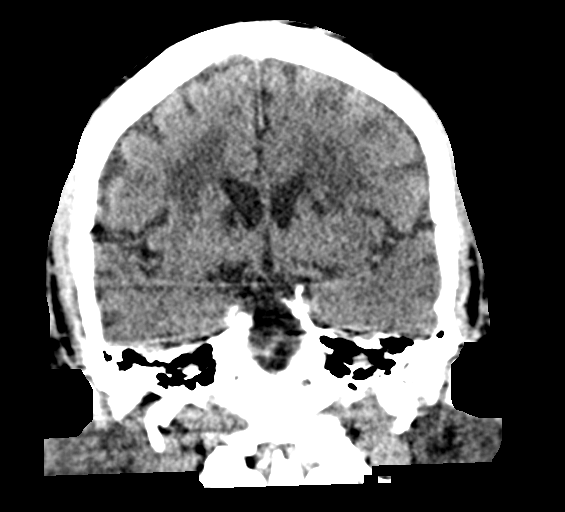
[im 42/75  brain]
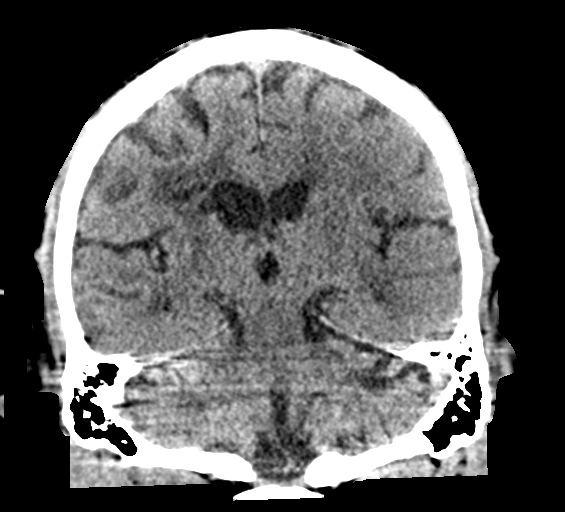

[Series 5: head 3.0 mpr sag · sagittal · 0.38mm/px · 3 of 64 slices shown]
[im 22/64  brain]
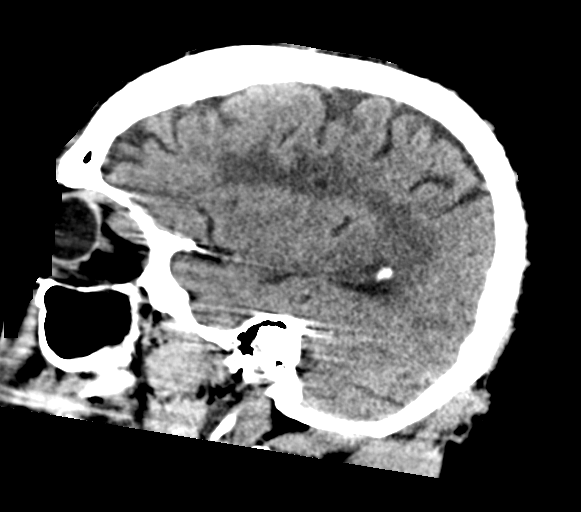
[im 32/64  brain]
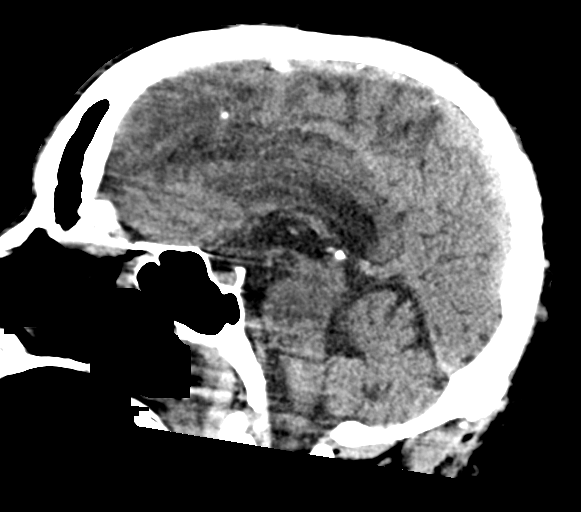
[im 43/64  brain]
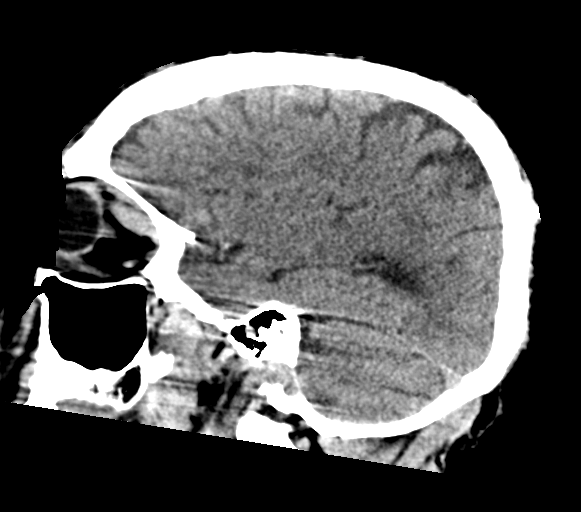

[15 of 47 positions shown; findings below may reference images not displayed]

FINDINGS: Brain: No evidence of acute infarction, hemorrhage, hydrocephalus,
extra-axial collection or mass lesion/mass effect.

Prominence of the ventricles and sulci reflects mild cortical volume
loss. Mild cerebellar atrophy is noted. Scattered periventricular
and subcortical white matter change likely reflects small vessel
ischemic microangiopathy. Chronic lacunar infarcts are noted at the
corona radiata bilaterally, and at the basal ganglia.

The brainstem and fourth ventricle are within normal limits. The
cerebral hemispheres demonstrate grossly normal gray-white
differentiation. No mass effect or midline shift is seen.

Vascular: No hyperdense vessel or unexpected calcification.

Skull: There is no evidence of fracture; visualized osseous
structures are unremarkable in appearance.

Sinuses/Orbits: The orbits are within normal limits. Mucosal
thickening is noted at the right maxillary sinus. The remaining
paranasal sinuses and mastoid air cells are well-aerated.

Other: No significant soft tissue abnormalities are seen.
IMPRESSION: 1. No acute intracranial pathology seen on CT.
2. Mild cortical volume loss and scattered small vessel ischemic
microangiopathy.
3. Chronic lacunar infarct at the corona radiata bilaterally, and at
the basal ganglia.
4. Mucosal thickening at the right maxillary sinus.
5.
